# Patient Record
Sex: Male | Born: 1990 | Race: White | Hispanic: No | Marital: Single | Smoking: Current every day smoker
Health system: Southern US, Community
[De-identification: ages and names within clinical notes are randomized; demographics above are authoritative.]

## PROBLEM LIST (undated history)

## (undated) DIAGNOSIS — G459 Transient cerebral ischemic attack, unspecified: Secondary | ICD-10-CM

## (undated) DIAGNOSIS — I456 Pre-excitation syndrome: Secondary | ICD-10-CM

## (undated) HISTORY — PX: ELBOW FRACTURE SURGERY: SHX616

## (undated) HISTORY — PX: ABLATION: SHX5711

## (undated) HISTORY — PX: TONSILLECTOMY: SUR1361

## (undated) HISTORY — PX: CARDIAC CATHETERIZATION: SHX172

---
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
## 2008-05-26 ENCOUNTER — Emergency Department: Payer: Self-pay | Admitting: Emergency Medicine

## 2008-05-26 ENCOUNTER — Ambulatory Visit: Payer: Self-pay | Admitting: Internal Medicine

## 2008-05-28 ENCOUNTER — Inpatient Hospital Stay: Payer: Self-pay | Admitting: Internal Medicine

## 2008-06-13 ENCOUNTER — Emergency Department: Payer: Self-pay | Admitting: Emergency Medicine

## 2008-06-25 ENCOUNTER — Inpatient Hospital Stay: Payer: Self-pay | Admitting: Internal Medicine

## 2009-11-05 ENCOUNTER — Emergency Department: Payer: Self-pay | Admitting: Emergency Medicine

## 2010-01-18 ENCOUNTER — Emergency Department: Payer: Self-pay | Admitting: Emergency Medicine

## 2010-01-25 ENCOUNTER — Emergency Department: Payer: Self-pay | Admitting: Unknown Physician Specialty

## 2010-04-05 ENCOUNTER — Inpatient Hospital Stay: Payer: Self-pay | Admitting: Internal Medicine

## 2010-06-01 ENCOUNTER — Emergency Department: Payer: Self-pay | Admitting: Emergency Medicine

## 2010-08-03 ENCOUNTER — Ambulatory Visit: Payer: Self-pay | Admitting: Internal Medicine

## 2011-07-10 ENCOUNTER — Emergency Department: Payer: Self-pay | Admitting: Emergency Medicine

## 2012-02-16 ENCOUNTER — Emergency Department: Payer: Self-pay | Admitting: *Deleted

## 2012-02-17 LAB — URINALYSIS, COMPLETE
Blood: NEGATIVE
Glucose,UR: NEGATIVE mg/dL (ref 0–75)
Ketone: NEGATIVE
Ph: 8 (ref 4.5–8.0)
Protein: NEGATIVE
Specific Gravity: 1.004 (ref 1.003–1.030)
WBC UR: <1 /HPF (ref 0–5)

## 2012-04-07 ENCOUNTER — Emergency Department: Payer: Self-pay | Admitting: Emergency Medicine

## 2012-04-09 ENCOUNTER — Emergency Department: Payer: Self-pay

## 2012-04-11 LAB — CBC
HCT: 44.7 % (ref 40.0–52.0)
HGB: 15 g/dL (ref 13.0–18.0)
RDW: 13.6 % (ref 11.5–14.5)
WBC: 8.3 10*3/uL (ref 3.8–10.6)

## 2012-04-11 LAB — COMPREHENSIVE METABOLIC PANEL
Anion Gap: 6 — ABNORMAL LOW (ref 7–16)
BUN: 8 mg/dL (ref 7–18)
Bilirubin,Total: 0.7 mg/dL (ref 0.2–1.0)
Calcium, Total: 9.1 mg/dL (ref 8.5–10.1)
Chloride: 106 mmol/L (ref 98–107)
Co2: 30 mmol/L (ref 21–32)
Creatinine: 0.97 mg/dL (ref 0.60–1.30)
Osmolality: 279 (ref 275–301)
Potassium: 3.3 mmol/L — ABNORMAL LOW (ref 3.5–5.1)
SGPT (ALT): 26 U/L (ref 12–78)
Total Protein: 7.7 g/dL (ref 6.4–8.2)

## 2012-04-11 LAB — ETHANOL: Ethanol %: <0.003 % (ref 0.000–0.080)

## 2012-04-12 ENCOUNTER — Inpatient Hospital Stay: Payer: Self-pay | Admitting: Psychiatry

## 2012-04-12 LAB — URINALYSIS, COMPLETE
Bilirubin,UR: NEGATIVE
Blood: NEGATIVE
Glucose,UR: NEGATIVE mg/dL (ref 0–75)
Ketone: NEGATIVE
Leukocyte Esterase: NEGATIVE
Nitrite: NEGATIVE
Ph: 6 (ref 4.5–8.0)
Protein: NEGATIVE
RBC,UR: <1 /HPF (ref 0–5)
Specific Gravity: 1.003 (ref 1.003–1.030)
Squamous Epithelial: <1

## 2012-04-12 LAB — DRUG SCREEN, URINE
Barbiturates, Ur Screen: NEGATIVE (ref ?–200)
Methadone, Ur Screen: NEGATIVE (ref ?–300)
Opiate, Ur Screen: NEGATIVE (ref ?–300)
Tricyclic, Ur Screen: NEGATIVE (ref ?–1000)

## 2012-04-15 LAB — LIPID PANEL
Cholesterol: 103 mg/dL (ref 0–200)
Ldl Cholesterol, Calc: 33 mg/dL (ref 0–100)
Triglycerides: 230 mg/dL — ABNORMAL HIGH (ref 0–200)
VLDL Cholesterol, Calc: 46 mg/dL — ABNORMAL HIGH (ref 5–40)

## 2012-06-06 ENCOUNTER — Emergency Department: Payer: Self-pay | Admitting: Emergency Medicine

## 2012-08-22 DIAGNOSIS — F1110 Opioid abuse, uncomplicated: Secondary | ICD-10-CM | POA: Insufficient documentation

## 2012-08-22 DIAGNOSIS — F99 Mental disorder, not otherwise specified: Secondary | ICD-10-CM | POA: Insufficient documentation

## 2012-08-22 DIAGNOSIS — M25529 Pain in unspecified elbow: Secondary | ICD-10-CM | POA: Insufficient documentation

## 2012-09-08 DIAGNOSIS — I456 Pre-excitation syndrome: Secondary | ICD-10-CM | POA: Insufficient documentation

## 2012-09-08 DIAGNOSIS — F32A Depression, unspecified: Secondary | ICD-10-CM | POA: Insufficient documentation

## 2012-09-08 DIAGNOSIS — F319 Bipolar disorder, unspecified: Secondary | ICD-10-CM | POA: Insufficient documentation

## 2012-12-19 ENCOUNTER — Emergency Department: Payer: Self-pay | Admitting: Emergency Medicine

## 2012-12-19 LAB — CBC
HGB: 15.1 g/dL (ref 13.0–18.0)
MCHC: 34.3 g/dL (ref 32.0–36.0)
Platelet: 339 10*3/uL (ref 150–440)
RBC: 4.88 10*6/uL (ref 4.40–5.90)
RDW: 13.4 % (ref 11.5–14.5)

## 2012-12-19 LAB — COMPREHENSIVE METABOLIC PANEL
Alkaline Phosphatase: 92 U/L (ref 50–136)
Anion Gap: 6 — ABNORMAL LOW (ref 7–16)
BUN: 14 mg/dL (ref 7–18)
Co2: 27 mmol/L (ref 21–32)
Creatinine: 1.18 mg/dL (ref 0.60–1.30)
Glucose: 92 mg/dL (ref 65–99)
Potassium: 3.3 mmol/L — ABNORMAL LOW (ref 3.5–5.1)
SGOT(AST): 27 U/L (ref 15–37)
SGPT (ALT): 31 U/L (ref 12–78)
Sodium: 136 mmol/L (ref 136–145)
Total Protein: 7.7 g/dL (ref 6.4–8.2)

## 2012-12-21 ENCOUNTER — Ambulatory Visit: Payer: Self-pay | Admitting: Family Medicine

## 2013-02-04 ENCOUNTER — Emergency Department: Payer: Self-pay | Admitting: Emergency Medicine

## 2013-02-10 ENCOUNTER — Emergency Department: Payer: Self-pay | Admitting: Emergency Medicine

## 2013-02-11 ENCOUNTER — Ambulatory Visit: Payer: Self-pay | Admitting: Physician Assistant

## 2013-03-08 ENCOUNTER — Emergency Department: Payer: Self-pay | Admitting: Emergency Medicine

## 2013-03-08 LAB — URINALYSIS, COMPLETE
Bilirubin,UR: NEGATIVE
Blood: NEGATIVE
Glucose,UR: NEGATIVE mg/dL (ref 0–75)
Ketone: NEGATIVE
Nitrite: NEGATIVE
Ph: 6 (ref 4.5–8.0)

## 2013-03-08 LAB — COMPREHENSIVE METABOLIC PANEL
Alkaline Phosphatase: 92 U/L (ref 50–136)
Anion Gap: 5 — ABNORMAL LOW (ref 7–16)
Bilirubin,Total: 0.3 mg/dL (ref 0.2–1.0)
Creatinine: 1.05 mg/dL (ref 0.60–1.30)
EGFR (African American): >60
EGFR (Non-African Amer.): >60
Osmolality: 278 (ref 275–301)
SGPT (ALT): 30 U/L (ref 12–78)

## 2013-03-08 LAB — CBC
HCT: 42.7 % (ref 40.0–52.0)
HGB: 14.7 g/dL (ref 13.0–18.0)
MCH: 30.7 pg (ref 26.0–34.0)
Platelet: 325 10*3/uL (ref 150–440)
RBC: 4.8 10*6/uL (ref 4.40–5.90)
RDW: 13.6 % (ref 11.5–14.5)

## 2013-03-08 LAB — DRUG SCREEN, URINE
Benzodiazepine, Ur Scrn: NEGATIVE (ref ?–200)
Cocaine Metabolite,Ur ~~LOC~~: POSITIVE (ref ?–300)
Methadone, Ur Screen: NEGATIVE (ref ?–300)
Opiate, Ur Screen: NEGATIVE (ref ?–300)
Phencyclidine (PCP) Ur S: NEGATIVE (ref ?–25)
Tricyclic, Ur Screen: NEGATIVE (ref ?–1000)

## 2013-03-08 LAB — ACETAMINOPHEN LEVEL: Acetaminophen: <2 ug/mL

## 2013-03-08 LAB — ETHANOL
Ethanol %: 0.135 % — ABNORMAL HIGH (ref 0.000–0.080)
Ethanol: 135 mg/dL

## 2013-03-08 LAB — SALICYLATE LEVEL: Salicylates, Serum: 4.3 mg/dL — ABNORMAL HIGH

## 2013-03-21 ENCOUNTER — Observation Stay: Payer: Self-pay | Admitting: Internal Medicine

## 2013-03-21 DIAGNOSIS — F17200 Nicotine dependence, unspecified, uncomplicated: Secondary | ICD-10-CM

## 2013-03-21 DIAGNOSIS — R079 Chest pain, unspecified: Secondary | ICD-10-CM

## 2013-03-21 DIAGNOSIS — F1910 Other psychoactive substance abuse, uncomplicated: Secondary | ICD-10-CM

## 2013-03-21 LAB — CBC
HGB: 16.1 g/dL (ref 13.0–18.0)
MCH: 30.8 pg (ref 26.0–34.0)
Platelet: 334 10*3/uL (ref 150–440)
RBC: 5.21 10*6/uL (ref 4.40–5.90)
WBC: 12.9 10*3/uL — ABNORMAL HIGH (ref 3.8–10.6)

## 2013-03-21 LAB — DRUG SCREEN, URINE
Amphetamines, Ur Screen: NEGATIVE (ref ?–1000)
Benzodiazepine, Ur Scrn: NEGATIVE (ref ?–200)
Cannabinoid 50 Ng, Ur ~~LOC~~: POSITIVE (ref ?–50)
Cocaine Metabolite,Ur ~~LOC~~: POSITIVE (ref ?–300)
MDMA (Ecstasy)Ur Screen: NEGATIVE (ref ?–500)
Phencyclidine (PCP) Ur S: NEGATIVE (ref ?–25)
Tricyclic, Ur Screen: NEGATIVE (ref ?–1000)

## 2013-03-21 LAB — CK TOTAL AND CKMB (NOT AT ARMC): CK, Total: 110 U/L (ref 35–232)

## 2013-03-21 LAB — ETHANOL
Ethanol %: 0.072 % (ref 0.000–0.080)
Ethanol: 72 mg/dL

## 2013-03-21 LAB — BASIC METABOLIC PANEL
Anion Gap: 9 (ref 7–16)
BUN: 16 mg/dL (ref 7–18)
Calcium, Total: 9.6 mg/dL (ref 8.5–10.1)
Chloride: 108 mmol/L — ABNORMAL HIGH (ref 98–107)
Co2: 22 mmol/L (ref 21–32)
EGFR (African American): >60
EGFR (Non-African Amer.): >60
Glucose: 86 mg/dL (ref 65–99)

## 2013-03-21 LAB — TROPONIN I
Troponin-I: <0.02 ng/mL
Troponin-I: <0.02 ng/mL

## 2013-03-21 LAB — ACETAMINOPHEN LEVEL: Acetaminophen: <2 ug/mL

## 2013-03-21 LAB — SALICYLATE LEVEL: Salicylates, Serum: 4.2 mg/dL — ABNORMAL HIGH

## 2013-03-28 LAB — CBC
HCT: 44.6 % (ref 40.0–52.0)
HGB: 15.2 g/dL (ref 13.0–18.0)
MCHC: 34 g/dL (ref 32.0–36.0)
Platelet: 303 10*3/uL (ref 150–440)
RBC: 4.92 10*6/uL (ref 4.40–5.90)

## 2013-03-28 LAB — COMPREHENSIVE METABOLIC PANEL
Alkaline Phosphatase: 72 U/L
Anion Gap: 7 (ref 7–16)
BUN: 14 mg/dL (ref 7–18)
Bilirubin,Total: 0.5 mg/dL (ref 0.2–1.0)
Calcium, Total: 9.3 mg/dL (ref 8.5–10.1)
Chloride: 103 mmol/L (ref 98–107)
Co2: 27 mmol/L (ref 21–32)
Creatinine: 1.11 mg/dL (ref 0.60–1.30)
EGFR (African American): >60
EGFR (Non-African Amer.): >60
Glucose: 91 mg/dL (ref 65–99)
Osmolality: 274 (ref 275–301)
Potassium: 3.4 mmol/L — ABNORMAL LOW (ref 3.5–5.1)
Sodium: 137 mmol/L (ref 136–145)

## 2013-03-28 LAB — URINALYSIS, COMPLETE
Bacteria: NONE SEEN
Ketone: NEGATIVE
Nitrite: NEGATIVE
Ph: 6 (ref 4.5–8.0)
RBC,UR: NONE SEEN /HPF (ref 0–5)
Squamous Epithelial: <1

## 2013-03-28 LAB — DRUG SCREEN, URINE
Barbiturates, Ur Screen: NEGATIVE (ref ?–200)
Benzodiazepine, Ur Scrn: NEGATIVE (ref ?–200)
Cannabinoid 50 Ng, Ur ~~LOC~~: POSITIVE (ref ?–50)
MDMA (Ecstasy)Ur Screen: NEGATIVE (ref ?–500)
Methadone, Ur Screen: NEGATIVE (ref ?–300)
Opiate, Ur Screen: NEGATIVE (ref ?–300)
Phencyclidine (PCP) Ur S: NEGATIVE (ref ?–25)
Tricyclic, Ur Screen: NEGATIVE (ref ?–1000)

## 2013-03-28 LAB — ETHANOL
Ethanol %: 0.167 % — ABNORMAL HIGH (ref 0.000–0.080)
Ethanol: 167 mg/dL

## 2013-03-28 LAB — ACETAMINOPHEN LEVEL: Acetaminophen: <2 ug/mL

## 2013-03-28 LAB — SALICYLATE LEVEL: Salicylates, Serum: 2.7 mg/dL

## 2013-03-29 ENCOUNTER — Inpatient Hospital Stay: Payer: Self-pay | Admitting: Psychiatry

## 2013-05-16 ENCOUNTER — Emergency Department: Payer: Self-pay | Admitting: Emergency Medicine

## 2013-05-16 LAB — CBC
HCT: 44.7 % (ref 40.0–52.0)
HGB: 14.6 g/dL (ref 13.0–18.0)
MCH: 29.6 pg (ref 26.0–34.0)
MCHC: 32.6 g/dL (ref 32.0–36.0)
MCV: 91 fL (ref 80–100)
PLATELETS: 421 10*3/uL (ref 150–440)
RBC: 4.94 10*6/uL (ref 4.40–5.90)
RDW: 13.5 % (ref 11.5–14.5)
WBC: 16.2 10*3/uL — ABNORMAL HIGH (ref 3.8–10.6)

## 2013-05-16 LAB — URINALYSIS, COMPLETE
BLOOD: NEGATIVE
Bacteria: NONE SEEN
Bilirubin,UR: NEGATIVE
GLUCOSE, UR: NEGATIVE mg/dL (ref 0–75)
Ketone: NEGATIVE
Nitrite: NEGATIVE
PROTEIN: NEGATIVE
Ph: 6 (ref 4.5–8.0)
RBC,UR: 1 /HPF (ref 0–5)
Specific Gravity: 1.001 (ref 1.003–1.030)
Squamous Epithelial: NONE SEEN
WBC UR: 6 /HPF (ref 0–5)

## 2013-05-16 LAB — COMPREHENSIVE METABOLIC PANEL
AST: 33 U/L (ref 15–37)
Albumin: 3.8 g/dL (ref 3.4–5.0)
Alkaline Phosphatase: 94 U/L
Anion Gap: 5 — ABNORMAL LOW (ref 7–16)
BILIRUBIN TOTAL: 0.3 mg/dL (ref 0.2–1.0)
BUN: 12 mg/dL (ref 7–18)
CHLORIDE: 103 mmol/L (ref 98–107)
CO2: 29 mmol/L (ref 21–32)
CREATININE: 1.03 mg/dL (ref 0.60–1.30)
Calcium, Total: 9.3 mg/dL (ref 8.5–10.1)
EGFR (African American): >60
Glucose: 105 mg/dL — ABNORMAL HIGH (ref 65–99)
Osmolality: 274 (ref 275–301)
POTASSIUM: 3.6 mmol/L (ref 3.5–5.1)
SGPT (ALT): 59 U/L (ref 12–78)
Sodium: 137 mmol/L (ref 136–145)
TOTAL PROTEIN: 8.1 g/dL (ref 6.4–8.2)

## 2013-05-16 LAB — ETHANOL
ETHANOL %: 0.059 % (ref 0.000–0.080)
ETHANOL LVL: 59 mg/dL

## 2013-05-16 LAB — ACETAMINOPHEN LEVEL: Acetaminophen: <2 ug/mL

## 2013-05-16 LAB — DRUG SCREEN, URINE
Amphetamines, Ur Screen: NEGATIVE (ref ?–1000)
BARBITURATES, UR SCREEN: NEGATIVE (ref ?–200)
Benzodiazepine, Ur Scrn: NEGATIVE (ref ?–200)
COCAINE METABOLITE, UR ~~LOC~~: POSITIVE (ref ?–300)
Cannabinoid 50 Ng, Ur ~~LOC~~: POSITIVE (ref ?–50)
MDMA (ECSTASY) UR SCREEN: NEGATIVE (ref ?–500)
METHADONE, UR SCREEN: NEGATIVE (ref ?–300)
OPIATE, UR SCREEN: NEGATIVE (ref ?–300)
Phencyclidine (PCP) Ur S: NEGATIVE (ref ?–25)
Tricyclic, Ur Screen: NEGATIVE (ref ?–1000)

## 2013-05-16 LAB — SALICYLATE LEVEL: SALICYLATES, SERUM: 2 mg/dL

## 2013-06-02 LAB — COMPREHENSIVE METABOLIC PANEL
ALK PHOS: 81 U/L
Albumin: 3.6 g/dL (ref 3.4–5.0)
Anion Gap: 9 (ref 7–16)
BILIRUBIN TOTAL: 0.6 mg/dL (ref 0.2–1.0)
BUN: 8 mg/dL (ref 7–18)
CALCIUM: 9 mg/dL (ref 8.5–10.1)
CHLORIDE: 103 mmol/L (ref 98–107)
Co2: 25 mmol/L (ref 21–32)
Creatinine: 1.01 mg/dL (ref 0.60–1.30)
EGFR (African American): >60
EGFR (Non-African Amer.): >60
GLUCOSE: 97 mg/dL (ref 65–99)
Osmolality: 272 (ref 275–301)
Potassium: 3.1 mmol/L — ABNORMAL LOW (ref 3.5–5.1)
SGOT(AST): 18 U/L (ref 15–37)
SGPT (ALT): 23 U/L (ref 12–78)
Sodium: 137 mmol/L (ref 136–145)
TOTAL PROTEIN: 7.3 g/dL (ref 6.4–8.2)

## 2013-06-02 LAB — URINALYSIS, COMPLETE
BACTERIA: NONE SEEN
Bilirubin,UR: NEGATIVE
Blood: NEGATIVE
Glucose,UR: NEGATIVE mg/dL (ref 0–75)
Ketone: NEGATIVE
Leukocyte Esterase: NEGATIVE
NITRITE: NEGATIVE
Ph: 7 (ref 4.5–8.0)
Protein: NEGATIVE
RBC,UR: NONE SEEN /HPF (ref 0–5)
Specific Gravity: 1.009 (ref 1.003–1.030)
Squamous Epithelial: <1

## 2013-06-02 LAB — CBC
HCT: 41.3 % (ref 40.0–52.0)
HGB: 14 g/dL (ref 13.0–18.0)
MCH: 30.1 pg (ref 26.0–34.0)
MCHC: 33.8 g/dL (ref 32.0–36.0)
MCV: 89 fL (ref 80–100)
Platelet: 362 10*3/uL (ref 150–440)
RBC: 4.64 10*6/uL (ref 4.40–5.90)
RDW: 13.4 % (ref 11.5–14.5)
WBC: 7.6 10*3/uL (ref 3.8–10.6)

## 2013-06-02 LAB — DRUG SCREEN, URINE
Amphetamines, Ur Screen: POSITIVE (ref ?–1000)
BARBITURATES, UR SCREEN: NEGATIVE (ref ?–200)
BENZODIAZEPINE, UR SCRN: NEGATIVE (ref ?–200)
CANNABINOID 50 NG, UR ~~LOC~~: POSITIVE (ref ?–50)
Cocaine Metabolite,Ur ~~LOC~~: POSITIVE (ref ?–300)
MDMA (ECSTASY) UR SCREEN: NEGATIVE (ref ?–500)
METHADONE, UR SCREEN: NEGATIVE (ref ?–300)
Opiate, Ur Screen: NEGATIVE (ref ?–300)
Phencyclidine (PCP) Ur S: NEGATIVE (ref ?–25)
TRICYCLIC, UR SCREEN: NEGATIVE (ref ?–1000)

## 2013-06-02 LAB — ACETAMINOPHEN LEVEL: Acetaminophen: <2 ug/mL

## 2013-06-02 LAB — SALICYLATE LEVEL: Salicylates, Serum: 2.8 mg/dL

## 2013-06-02 LAB — TSH: THYROID STIMULATING HORM: 0.385 u[IU]/mL — AB

## 2013-06-02 LAB — ETHANOL
Ethanol %: 0.061 % (ref 0.000–0.080)
Ethanol: 61 mg/dL

## 2013-06-03 ENCOUNTER — Inpatient Hospital Stay: Payer: Self-pay | Admitting: Psychiatry

## 2013-06-14 ENCOUNTER — Emergency Department: Payer: Self-pay | Admitting: Emergency Medicine

## 2013-06-14 LAB — COMPREHENSIVE METABOLIC PANEL
ALK PHOS: 89 U/L
ALT: 25 U/L (ref 12–78)
AST: 20 U/L (ref 15–37)
Albumin: 4.1 g/dL (ref 3.4–5.0)
Anion Gap: 5 — ABNORMAL LOW (ref 7–16)
BUN: 16 mg/dL (ref 7–18)
Bilirubin,Total: 0.7 mg/dL (ref 0.2–1.0)
CO2: 31 mmol/L (ref 21–32)
Calcium, Total: 9.4 mg/dL (ref 8.5–10.1)
Chloride: 103 mmol/L (ref 98–107)
Creatinine: 1.36 mg/dL — ABNORMAL HIGH (ref 0.60–1.30)
EGFR (African American): >60
GLUCOSE: 108 mg/dL — AB (ref 65–99)
OSMOLALITY: 279 (ref 275–301)
POTASSIUM: 3.5 mmol/L (ref 3.5–5.1)
SODIUM: 139 mmol/L (ref 136–145)
Total Protein: 7.9 g/dL (ref 6.4–8.2)

## 2013-06-14 LAB — DRUG SCREEN, URINE
Amphetamines, Ur Screen: NEGATIVE (ref ?–1000)
Barbiturates, Ur Screen: NEGATIVE (ref ?–200)
Benzodiazepine, Ur Scrn: NEGATIVE (ref ?–200)
CANNABINOID 50 NG, UR ~~LOC~~: POSITIVE (ref ?–50)
COCAINE METABOLITE, UR ~~LOC~~: POSITIVE (ref ?–300)
MDMA (Ecstasy)Ur Screen: NEGATIVE (ref ?–500)
Methadone, Ur Screen: NEGATIVE (ref ?–300)
Opiate, Ur Screen: NEGATIVE (ref ?–300)
PHENCYCLIDINE (PCP) UR S: NEGATIVE (ref ?–25)
Tricyclic, Ur Screen: POSITIVE (ref ?–1000)

## 2013-06-14 LAB — URINALYSIS, COMPLETE
BLOOD: NEGATIVE
Bacteria: NONE SEEN
Bilirubin,UR: NEGATIVE
GLUCOSE, UR: NEGATIVE mg/dL (ref 0–75)
KETONE: NEGATIVE
Leukocyte Esterase: NEGATIVE
Nitrite: NEGATIVE
Ph: 5 (ref 4.5–8.0)
Protein: NEGATIVE
RBC,UR: 2 /HPF (ref 0–5)
Specific Gravity: 1.025 (ref 1.003–1.030)
WBC UR: 7 /HPF (ref 0–5)

## 2013-06-14 LAB — CBC
HCT: 45 % (ref 40.0–52.0)
HGB: 15 g/dL (ref 13.0–18.0)
MCH: 29.5 pg (ref 26.0–34.0)
MCHC: 33.3 g/dL (ref 32.0–36.0)
MCV: 89 fL (ref 80–100)
Platelet: 317 10*3/uL (ref 150–440)
RBC: 5.08 10*6/uL (ref 4.40–5.90)
RDW: 13.9 % (ref 11.5–14.5)
WBC: 8.1 10*3/uL (ref 3.8–10.6)

## 2013-06-14 LAB — ETHANOL: Ethanol %: <0.003 % (ref 0.000–0.080)

## 2013-06-14 LAB — SALICYLATE LEVEL: Salicylates, Serum: 2 mg/dL

## 2013-06-14 LAB — ACETAMINOPHEN LEVEL: Acetaminophen: <2 ug/mL

## 2013-11-21 ENCOUNTER — Emergency Department: Payer: Self-pay | Admitting: Emergency Medicine

## 2014-03-12 ENCOUNTER — Emergency Department: Payer: Self-pay | Admitting: Emergency Medicine

## 2014-04-26 ENCOUNTER — Emergency Department: Payer: Self-pay | Admitting: Emergency Medicine

## 2014-05-21 ENCOUNTER — Emergency Department: Payer: Self-pay | Admitting: Emergency Medicine

## 2014-05-22 LAB — COMPREHENSIVE METABOLIC PANEL
ALK PHOS: 76 U/L (ref 46–116)
ANION GAP: 6 — AB (ref 7–16)
AST: 28 U/L (ref 15–37)
Albumin: 3.9 g/dL (ref 3.4–5.0)
BUN: 12 mg/dL (ref 7–18)
Bilirubin,Total: 0.3 mg/dL (ref 0.2–1.0)
CALCIUM: 9 mg/dL (ref 8.5–10.1)
CHLORIDE: 108 mmol/L — AB (ref 98–107)
CREATININE: 0.94 mg/dL (ref 0.60–1.30)
Co2: 28 mmol/L (ref 21–32)
EGFR (African American): >60
GLUCOSE: 92 mg/dL (ref 65–99)
OSMOLALITY: 283 (ref 275–301)
POTASSIUM: 3.5 mmol/L (ref 3.5–5.1)
SGPT (ALT): 31 U/L (ref 14–63)
Sodium: 142 mmol/L (ref 136–145)
Total Protein: 6.9 g/dL (ref 6.4–8.2)

## 2014-05-22 LAB — CBC WITH DIFFERENTIAL/PLATELET
Basophil #: 0.1 10*3/uL (ref 0.0–0.1)
Basophil %: 0.7 %
EOS PCT: 2.7 %
Eosinophil #: 0.2 10*3/uL (ref 0.0–0.7)
HCT: 43.3 % (ref 40.0–52.0)
HGB: 14.8 g/dL (ref 13.0–18.0)
Lymphocyte #: 2.8 10*3/uL (ref 1.0–3.6)
Lymphocyte %: 36.2 %
MCH: 30.4 pg (ref 26.0–34.0)
MCHC: 34.1 g/dL (ref 32.0–36.0)
MCV: 89 fL (ref 80–100)
MONOS PCT: 8.2 %
Monocyte #: 0.6 x10 3/mm (ref 0.2–1.0)
NEUTROS PCT: 52.2 %
Neutrophil #: 4.1 10*3/uL (ref 1.4–6.5)
Platelet: 285 10*3/uL (ref 150–440)
RBC: 4.86 10*6/uL (ref 4.40–5.90)
RDW: 13.2 % (ref 11.5–14.5)
WBC: 7.8 10*3/uL (ref 3.8–10.6)

## 2014-07-09 ENCOUNTER — Emergency Department: Payer: Self-pay | Admitting: Emergency Medicine

## 2014-08-10 NOTE — Discharge Summary (Signed)
PATIENT NAME:  Michael LevanSMITH, Jamine MR#:  [id number] DATE OF BIRTH:  1991/03/10  DATE OF ADMISSION:  04/12/2012 DATE OF DISCHARGE:    HOSPITAL COURSE: The patient was admitted to the hospital after presenting to the Emergency Room stating that he was feeling out of control and insinuating suicidal ideation and feeling like his medications were not right. In the hospital, he has denied any suicidal ideation and has not behaved in a dangerous or aggressive way. He has been compliant with medication. The patient has stated his belief that taking Klonopin and Ativan were highly effective for him in the past. Review of past chart suggests a long history of substance dependence with antipsychotics probably being of  the most benefit in helping with his acute symptoms.  Diagnosis has been changed several times over the years, but the symptom pattern I see looks to be most consistent with schizoaffective disorder, substance abuse and probably antisocial personality disorder. The patient was advised that he would not be put on standing doses of benzodiazepines as there was no real clinical indication for them, and he had a history of substance abuse. I did agree to put him on both Seroquel and Geodon, given that he has history, by his report, of having failed treatment with several individual antipsychotics and also of having severe EPS versus NMS with Haldol in the past, not wanting to be back on clozapine, not having been really compliant with clozapine. The patient was requesting to be on the combination of Seroquel and Geodon and has tolerated it well. At this point he is denying suicidal ideation and denying any hallucinations or other acute psychotic symptoms. He is agreeable to continuing with follow-up at [facility] in the community and has a safe place to live. He has been educated about the importance of staying sober and off of all drugs of abuse and continuing to be compliant with treatment to avoid worsening symptoms or  suicidal ideation.   MEDICATIONS AT DISCHARGE:  Ziprasidone 80 mg twice a day with food, trazodone 150 mg at night, Seroquel 400 mg twice a day, gabapentin 300 mg at bedtime, Celexa 20 mg per day.   LABORATORY RESULTS: Admission labs showed a drug screen that was negative. TSH normal at 1.15. Alcohol undetectable.  Glucose low at 62, potassium slightly low at 3.3. CBC was normal. Urinalysis unremarkable. Follow-up with the lipids showed an elevated triglycerides at 230, HDL low at 24, VLDL high at 46.   MENTAL STATUS EXAM AT DISCHARGE:  Casually dressed, neatly groomed man who looks his stated age. Cooperative with the interview. Good eye contact. Normal psychomotor activity. Speech normal in rate, tone, and volume. Affect euthymic, reactive. Mood stated as fine. Thoughts are lucid without loosening of associations. Denies auditory or visual hallucinations. Denies suicidal or homicidal ideation. Shows improved judgment and insight, of normal intelligence. Alert and oriented x 4.   DISPOSITION: Follow-up with [facility], continue to stay at home with his family.   DIAGNOSES PRINCIPAL AND PRIMARY: AXIS I: Schizoaffective disorder, bipolar type, depressed.   SECONDARY DIAGNOSES: AXIS I:  Polysubstance dependence.  AXIS II:  Antisocial personality traits, at least.  AXIS III:  None.  AXIS IV: Moderate to severe from primary support, lack of cooperation often with outpatient treatment.  AXIS V:  Functioning at time of discharge 60.     ____________________________ Audery AmelJohn T. Berneta Sconyers, MD jtc:ct D: 04/15/2012 10:53:28 ET T: 04/15/2012 11:37:15 ET JOB#: [id number]  cc: Audery AmelJohn T. Sharilyn Geisinger, MD, <Dictator> Audery AmelJOHN T Perez Dirico MD ELECTRONICALLY  SIGNED 04/17/2012 16:28

## 2014-08-10 NOTE — H&P (Signed)
PATIENT NAME:  Michael Michael [name], Michael [name] MR#:  [id number] DATE OF BIRTH:  07-25-1990  DATE OF ADMISSION:  04/12/2012  INITIAL ASSESSMENT AND PSYCHIATRIC EVALUATION   IDENTIFYING INFORMATION: The patient is a 24 year old white male, not employed, never married and lives with his adopted mother who is 49 years old.  CHIEF COMPLAINT: The patient comes in to psychiatry at [facility] with a chief complaint "I just want to be on the right medication.  My doctor at [facility] is not giving me the right medication at this time".    HISTORY OF PRESENT ILLNESS:  According to information obtained from the chart, the patient has not been taking his medications for 8 months.  He reports that he was on Ativan 2 mg 4 times a day and also Klonopin 1 mg 4 times a day as needed and Geodon.  His currently doctor refused to give that medication and put him on Seroquel 400 mg at bedtime which has not been helping.  He wanted to get admitted to the hospital because he is getting increasingly depressed and not feeling well and wants to be back on the medications that he had been getting.    PAST PSYCHIATRIC HISTORY:  This is the 5th or 6th  inpatient on psychiatry.  First inpatient for psychiatry was at a very young age.  The longest period in inpatient psychiatry was for 8 months at [facility] in [place] where he was an inpatient for 8 months because he tried to hurt himself and other people and was hearing voices and was paranoid.  He was last admitted to 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  The patient reports that he was being followed at [facility] and his last appointment was quite some time ago as he is not getting the medication of his  choice.  History of suicide attempt twice.  He tried to overdose on pills on 1 occasion and tried to eat a bar of soap on 1 occasion.  FAMILY HISTORY OF MENTAL ILLNESS:  The patient lives with adoptive mother.  Biological mother has mental illness.  Younger brother has mental illness.  No known history of suicides in the family.  FAMILY HISTORY:  Raised by adoptive mother.  Dropped out in 10th grade because he did not like school, no GED.    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  The longest job lasted for 4 years in [employer].  He last worked 4 years ago.    MARRIAGES:  Never married.  Dated a little bit.  No children.    ALCOHOL AND DRUGS:  The first drink of alcohol was at 10 years.  He was going through alcohol withdrawal.  Admits that currently he drinks alcohol on a social basis.  Has 1 DWI that was 2-1/2 years ago and lost his driver's license.  He got his driver's license back.  Denies smoking THC.  Denies using crack cocaine, but according to the information obtained he told the previous doctor that his drug of choice was crack cocaine.  He reports, "I don't do that stuff anymore."  He last used crack cocaine a long time ago.  Smokes cigarettes at a rate of a pack a half a day for  many years.    MEDICAL HISTORY:  No known history of high blood pressure.  No known history of diabetes mellitus.  Status post tonsillectomy and adenoidectomy many years ago.  Had a fracture of the left arm wrestling in the past.  Uses a brace on the left elbow for stability because recently he had a fall, on Monday, 04/07/2012.  He had head trauma with loss of consciousness 5 minutes but never had seizures.  History of attention deficit hyperactivity disorder, combined type.  He is being [place]ellen [place]Eason [place].  Last appointment was quite some time ago.  Next appointment is to be made.  LEGAL HISTORY:  The patient served 2 years 4 months in prison  and was released only 8 months for obtaining property on false presence.  He is on probation for 26 months more.  He admits that he has been clean from alcohol or drugs while he was in prison.  ALLERGIES:  NO KNOWN DRUG ALLERGIES.  PHYSICAL EXAMINATION:  VITAL SIGNS:  Temperature 97.2, pulse 66 per minute and regular, respirations 20 per minute and regular, blood pressure 116/70 mmHg. HEENT: Head is normocephalic, atraumatic.  Eyes: PERLA.  Fundi are bilaterally benign.  EOMs are intact.  Tympanic membranes are moist.  No exudates. NECK: Supple without any organomegaly, lymphadenopathy or thyromegaly.  CHEST: Normal expansion, normal breath sounds heard.  HEART:  Normal S1, S2 without any murmurs or gallops.  ABDOMEN:  Soft, no organomegaly.  Bowel sounds heard.   RECTAL:  Deferred. EXTREMITIES:  The patient has a brace on his left arm, wearing a splint because he had a fall on 04/07/2012. NEUROLOGIC: Gait is normal.  Romberg is normal.  Cranial nerves II through XII are intact. DTRs 2+ and intact.  Plantars are normal MENTAL STATUS EXAMINATION: The patient is dressed in street clothes.  Alert and oriented to place, but he said it was the 19th of December, 2013 which was wrong because today is the 21st.  Admit feeling depressed because he is not getting the medications of his choice.  Admits to feeling anxious because of not getting the medications of his choice.  He feels hopeless but does not feel helpless because he thinks he is going to be helped.  Denies feeling useless or worthless.  Denies any ideas or plans to hurt himself or others.  He stated, "I don't want to hurt myself".  Denies paranoid or suspicious ideas.  Denies hearing voices or seeing things.  Appears to be restless, fidgety and labile with his mood and affect.  Speech is coherent but rather pressured and rapid.  He is upset and angry about him not getting the medications that he wants.  He denies any auditory or visual  hallucinations.  Memory is intact and recall is good.  He could remember all the 3 items that I asked him after a few minutes.  Concentration is less and he is not able to focus.  He can spell the word "world" forward.  When he was asked to spell the word "world" backward, he said, "I just woke up, I said, I cannot focus", very impulsive with poor judgment.  Intelligence is average.  Insight and judgment guarded.  IMPRESSION: AXIS I:  Attention deficit hyperactivity disorder, combined type.  History of polysubstance dependence in remission - cocaine and Delta-9-tetrahydrocannabinol, bipolar disorder not otherwise specified. AXIS II:  Personality disorder with antisocial traits. AXIS III:  Status post tonsillectomy, status post adenoidectomy, status post fracture  of left elbow and surgery for the same.  He has a brace put in.   AXIS IV:  Severe - occupational and financial, history of polysubstance dependence, developmental stress.  Currently he has problems with the legal system and is on probation. AXIS  V:  Global Assessment of Functioning is 25.  PLAN:  The patient was admitted to [facility] for close observation and management.  He will be started back on the medications that he has been taking and this will be adjusted until he is stabilized.  During his stay in the hospital he will begin milieu therapy and supportive counseling.  He will take part in group therapy where compliance issues will be addressed along with legal issues.  At the time of discharge, the patient's medications will be stabilized  and appropriate followup appointment will be made in the community.    ____________________________ Jannet Mantis. Guss Bunde, MD skc:ct D: 04/12/2012 21:02:00 ET T: 04/13/2012 09:53:12 ET JOB#: [id number]  cc: Monika Salk K. Guss Bunde, MD, <Dictator> Beau Fanny MD ELECTRONICALLY SIGNED 04/13/2012 21:21

## 2014-08-13 NOTE — H&P (Signed)
PATIENT NAME:  Michael [name], Michael [name] MR#:  [id number] DATE OF BIRTH:  12/07/1990  DATE OF ADMISSION:  03/21/2013.  PRIMARY CARE PHYSICIAN:  None.  HISTORY OF PRESENT ILLNESS:  The patient is a 24 year old Caucasian male with past medical history significant for a history of psychiatric disease, ADHD, bipolar disorder and polysubstance abuse, who presents to the hospital with complaints of chest pains. The patient was doing well up until morning, around 3 a.m., when he started having left-sided chest pain. The pain was described as sharp intermittent pain, came on slowly and progressively worsening, and then became constant, radiating to left side of the body with some left-sided tingling, numbness, which lasted approximately one hour. He was also experiencing shortness of breath as well as palpitations in his chest, felt lightheaded as well as dizzy and diaphoretic and felt presyncopal. He was also having some headaches on the right side of his head towards the back. Because of this discomfort, he decided to come to Emergency Room for further evaluation. In the Emergency Room, he was noted to have urine drug screen which was positive for cocaine, and hospitalist services were contacted for admission. The patient admitted to snorting cocaine at around 7:00 p.m. last night. He admits of having had chest pains in the past which he had with palpitations with WPW syndrome. He had operation for WPW two years ago at [facility].  PAST MEDICAL HISTORY:  Significant for history of self-inflicted cuts in November 2014, history of multiple psychiatric admissions, bipolar disorder, ADHD, substance abuse, suicide with overdose, polysubstance dependence.   MEDICATIONS:  According to the patient, the patient was supposed to be on paroxetine, lithium, trazodone, Geodon, Neurontin as well as Zyprexa; however, he is not taking any medications. In the past, he was also on Suboxone because of opiate dependence as well as Xanax. Not taking this  as well.   PAST SURGICAL HISTORY:  Tonsillectomy and adenoidectomy, also left elbow operation, which left him with limited range of motion.   ALLERGIES:  HALDOL, VICODIN AS WELL AS RISPERDAL.   FAMILY HISTORY:  Hypertension as well as coronary artery disease. No stroke, diabetes or cancers. He is adopted.   SOCIAL HISTORY:  The patient is single, has no children. Smokes approximately 2 packs a day since the age of 24. Also does cocaine intermittently and marijuana. In the past, he was addicted to opiates, denies any opiate use recently. Drinks alcohol occasionally socially; yesterday, however, he drank at least 6 or 7 shots of vodka.   REVIEW OF SYSTEMS:  CONSTITUTIONAL:  Positive for feeling chills earlier today as well as fatigue and weakness, pains in the chest, glasses for nearsightedness, sinus congestion, some cough and greenish-brownish phlegm, intermittently, also intermittent wheezing, significant extreme shortness of breath as well as arrhythmias earlier today with chest pains, feeling presyncopal on top of having palpitations as well as nauseated. Denies any fevers, weight loss or gain, denies blurry vision, double vision, glaucoma or cataracts.  EARS, NOSE, THROAT:  Denies any tinnitus, allergies, epistaxis, sinus tenderness, dentures, difficulty swallowing. RESPIRATORY:  Denies any wheezes, asthma, COPD.  CARDIOVASCULAR:  Denies any orthopnea, edema, arrhythmias, palpitations or syncope except as mentioned above.  GASTROINTESTINAL:  Denies any nausea, vomiting, diarrhea, abdominal pain, constipation, or change in bowel habits.  GENITOURINARY:  Denies dysuria, hematuria, frequency, incontinence. ENDOCRINE:  Denies polyuria, nocturia or thyroid problems, heat or cold intolerance or thirst.   SKIN:  Denies any acne, rashes or change in moles.  MUSCULOSKELETAL:  Denies arthritis, cramps, joint  swelling or gout. NEUROLOGICAL:  No numbness, epilepsy, or tremor.  PSYCHIATRIC:  Denies  anxiety, insomnia, depression.   PHYSICAL EXAMINATION:  VITAL SIGNS:  On arrival to the hospital, temperature was not measured, pulse was 80s to 90s, respiration rate was 18, blood pressure 121/69, saturation was 97% on room air.  GENERAL:  This is a well-developed, well-nourished, thin Caucasian male in no significant distress, somewhat anxious, lying on the stretcher.  HEENT:   Pupils are equal, reactive to light. Extraocular muscles intact. No conjunctivitis. Has normal hearing. No pharyngeal erythema. Mucosa is moist.  NECK:   No masses. Supple, nontender. Thyroid is not enlarged. No adenopathy. No JVD or carotid bruits bilaterally. Full range of motion.  LUNGS:   Clear to auscultation in all fields. Somewhat diminished breath sounds, but otherwise no rales, rhonchi or wheezing. Unlabored respirations.  CHEST:  Clear to auscultation, no dullness to percussion or overt respiratory distress. CARDIOVASCULAR:  S1 and S2 appreciated, rhythm is regular. PMI not lateralized. Chest is somewhat tender to palpation on the left side. Pedal pulses 1+.  EXTREMITIES:  No lower extremity edema, clubbing or cyanosis was noted.  ABDOMEN:  Soft, nontender. Bowel sounds are present. No hepatosplenomegaly or masses were noted.  RECTAL:  Deferred.  MUSCLE STRENGTH:  Able to move all extremities. No cyanosis, degenerative joint disease or kyphosis. Gait is not tested.  SKIN:  Revealed multiple tattoos. No other rash, lesions, erythema, nodularity. Skin was warm and dry to palpation. LYMPH:  No adenopathy in the cervical region. NEUROLOGIC:  Cranial nerves grossly intact. Sensory is intact. No dysarthria or aphasia. The patient is alert and oriented to time, person and place, cooperative. Memory is good.  PSYCHIATRIC:  No significant confusion, agitation or depression noted.   LABORATORY, DIAGNOSTIC, AND RADIOLOGICAL DATA:  EKG showed sinus tach at 109 beats per minute, normal axis, possible left atrial enlargement  according to EKG criteria. Borderline EKG, no acute ST T changes were noted.   Potassium level of 2.9, otherwise BMP was unremarkable. Alcohol level was 0.072%. Cardiac enzymes x 2 were within normal limits. Urine drug screen was positive for cocaine as well as cannabinoids. The patient's white blood cell count was elevated to 12.9, hemoglobin was 16.1, platelet count was 334. Coagulation panel was not checked. Tylenol level less than 2.0, and salicylate level was 4.2.   ASSESSMENT AND PLAN:  1.  Chest pains. Admit patient to medical floor. Start him on aspirin, follow cardiac enzymes x 3. Get cardiologist evaluation and get echocardiogram done.  2.  Polysubstance abuse. Get psychiatrist involved again. The patient would benefit from psychiatry followup as outpatient and he agreeable for that.  3.  Hypokalemia, check magnesium level, supplement p.o.  4.  Elevated white blood cell count, possibly stress related, follow with therapy.  5.  Tobacco abuse. Discussed cessation for 5 minutes. Nicotine replacement therapy will be initiated.   TIME SPENT:  50 minutes on this patient.   ____________________________ Katharina Caper, MD rv:jm D: 03/21/2013 09:51:01 ET T: 03/21/2013 10:59:48 ET JOB#: [id number]  cc: Katharina Caper, MD, <Dictator> Primary care physician  Maleta Pacha MD ELECTRONICALLY SIGNED 04/01/2013 15:19

## 2014-08-13 NOTE — Consult Note (Signed)
PATIENT NAME:  Michael [name], Michael [name] [id number] OF BIRTH:  1990-09-23 OF ADMISSION:  11/16/2014OF CONSULTATION: 03/09/2013 PHYSICIAN: Dr. Sharma Covert PHYSICIAN: Kristine Linea, MD FOR CONSULTATION: To evaluate a suicidal patient.  DATA: Michael [name] is a 24 year old male with a history of mood instability.   COMPLAINT: "I cut myself."   OF PRESENT ILLNESS:  Michael [name] was brought to the ER by EMS with self inflicted left arm laceration. He cut himself while drunk on an anniversary of his father?s death. He was initially irritable in the ER and did not want to participate in intake interview. By the time I evaluated the patient, he was sober and cooperative. He adamantly denies suicidal or homicidal ideation. He denied any symptoms of depression, anxiety or psychosis. He denies symptoms suggestive of bipolar mania. He denies heavy drinking. He has been in the care of [facility] [place] where he is prescribed 8 mg of suboxone to take three times daily. His next visit is on 11/29. He has not been seeing a psychiatrist outside of [facility] lately but wants to return to Dr. Janeece Riggers in hopes to get benzodiazepines.  PSYCHIATRIC HISTORY:  The patient had multiple psychiatric admissions including extended, 8 month stay in the hospital in [place]. He has past diagnoses of bipolar, ADHD, PTSD and substance abuse. He used to cut but has not done it in 6 years. He attempted a suicide twice by overdose. He has been tried on numerous medications but believes that he only needs benzodiazepines. There is a history of polysubstance dependence. HISTORY OF MENTAL ILLNESS:  His biological mother and his brother have mental illness. No known history of suicides in the family. MEDICAL HISTORY: None.  Adderall, Haldol, Risperdal, Tramadol, Vicodin.  ON ADMISSION: Suboxone 8/2 mg three times daily, Xanax. HISTORY: The patient is adopted. He dropped out in the 10th grade, no GED. He has been incarcerated. He lives with his  girlfriend.   OF SYSTEMS:No fevers or chills. No weight changes. No double or blurred vision. No hearing loss. No shortness of breath or cough. No chest pain or orthopnea. No abdominal pain, nausea, vomiting, or diarrhea. No incontinence or frequency. No heat or cold intolerance. No anemia or easy bruising. Cut on the arm.    No muscle or joint pain. No tingling or weakness.   See history of present illness for details EXAMINATION:SIGNS: Temperature 96.8, heart rate 82, blood pressure 98/54, respirations 18.  This is a well developed male in no acute distress. rest of the physical examination is deferred to his primary attending.   DATA: Chemistries are within normal limits except K 3.3. Blood alcohol level 0.135. LFTs within normal limits. TSH 1.28. Urine tox screen is positive for cocaine. CBC within normal limits. Urinalysis is not suggestive for UTI.  STATUS EXAMINATION: The patient is alert and oriented to person, place, time and situation. He is pleasant, polite and cooperative. He maintains good eye contact. He is well groomed wearing hospital scrubs. His mood is "fine" with full affect. Thought process is logical. He denies suicidal or homicidal ideation. There are no delusions, paranoia, or hallucinations. His cognition is grossly intact. His insight and judgment are fair.  I:  Alcohol dependence.    Cocaine dependence.   Opioid dependence.II:  Deferred. III:  Deferred. IV:  Substance abuse. V:  Global assessment of functioning 50.   The patient no longer meets criteria for IVC. I will terminate proceedings. Please discharge as appropriate. He will follow up with [facility]  in [place] on 11/29. He will follow up with Dr. Janeece [facility]gersSu on 11/20 at 8:15 am.   Electronic Signatures: Kristine LineaPucilowska, Emery Binz (MD)  (Signed on 20-Nov-14 23:31)  Authored  Last Updated: 20-Nov-14 23:31 by Kristine LineaPucilowska, Hiran Leard (MD)
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
## 2014-08-13 NOTE — Consult Note (Signed)
Brief Consult Note: Diagnosis: Polysubstance dependence.   Patient was seen by consultant.   Consult note dictated.   Recommend further assessment or treatment.   Orders entered.   Comments: [name] has a h/o schizoaffcetgive disorder off medications and polysubstance dependence. He has been off suboxone for the past 2 weeks and missed his appointment at [facility] in [place] today. It is unclear if he kept his appointment with Dr. Janeece [name] om 11/20. He was admitted with chest pain after cocaine binge.    PLAN: 1. The patient agrees to come to psychiatry for treatment of bipolar and substance abuse. Please transfer.  Electronic Signatures: Kristine LineaPucilowska, Pradyun Ishman (MD)  (Signed [phone]-Nov-14 15:43)  Authored: Brief Consult Note   Last Updated: 29-Nov-14 15:43 by Kristine LineaPucilowska, Tymber Stallings (MD)

## 2014-08-13 NOTE — Discharge Summary (Signed)
PATIENT NAME:  Michael LevanSMITH, Dashiel MR#:  [id number] DATE OF BIRTH:  16-Jul-1990  DATE OF ADMISSION:  03/21/2013  DATE OF DISCHARGE:  03/21/2013  ADMITTING DIAGNOSIS:  Chest pain.  DISCHARGE DIAGNOSES:   1.  Chest pain, no myocardial infarction. 2.  Polysubstance abuse. 3.  Hypokalemia. 4.  Leukocytosis. 5.  Tobacco abuse.   DISCHARGE CONDITION: Stable.   DISCHARGE MEDICATIONS: The patient is to start aspirin 81 mg p.o. daily, potassium chloride 40 mEq p.o. twice daily, nicotine oral inhaler 1 inhalation every 1 to 2 hours as needed, nicotine transdermal patch 21 mg topically daily.   INSTRUCTIONS: Home oxygen: None. Diet: 2 gram sodium, low-fat, low-cholesterol, regular consistency. Activity limitations as tolerated. Followup appointment with Dr. Mariah MillingGollan, Cardiology, 2 days after discharge. Mental health center in [place] 2 days after discharge. He is to follow up with Dr. Janeece RiggersSu at the next scheduled appointment.   CONSULTANTS: Care Management, social worker, Dr. Jennet MaduroPucilowska, psychiatrist, and Dr. Mariah MillingGollan, Cardiology.   RADIOLOGIC STUDIES: Chest x-ray PA and lateral, 03/21/2013, revealed no acute cardiopulmonary disease. CT scan of head without contrast, 03/21/2013, revealed normal head CT with no acute intracranial process.   REASON FOR ADMISSION: The patient is a 24 year old Caucasian male with history of polysubstance abuse, also psychiatric history, who presented to the hospital after a cocaine binge on 03/21/2013. Please refer to Dr. Arlys JohnVaickute's admission note on 03/21/2013. On arrival to the hospital, patient was afebrile, heart rate was in the 70s to 90s, respiratory rate was in the 20s, his blood pressure was normal at 130s/70s, O2 sat was 97% on room air. Physical examination was unremarkable. The patient's EKG showed normal sinus rhythm, with no acute ST-T changes.   LAB DATA: Done in the Emergency Room revealed a low potassium level of 2.9 and magnesium level of 2.5. Alcohol level was 0.072%.  Cardiac enzymes, first set as well as 2 more sets, were within normal limits. Urine drug screen was positive for cocaine as well as cannabinoids. White blood cell count was mildly elevated to 12.9, hemoglobin was 16.1, platelet count 334. Acetaminophen level was less than 2.0, salicylate level was 4.2.   HOSPITAL COURSE: The patient was admitted to the hospital for further evaluation. Cardiology consultation with Dr. Mariah MillingGollan was obtained. Dr. Mariah MillingGollan saw patient in consultation. He discussed with patient risks of cocaine abuse. He felt that it was likely a coronary spasm. However, it was atypical in nature, and he recommended cocaine cessation. He also recommended to discharge patient from cardiac perspective. In regards to polysubstance abuse, Psychiatry was counseled, and Dr. Jennet MaduroPucilowska saw patient in consultation the same day. Dr. Jennet MaduroPucilowska, while talking to the patient, felt that he would benefit from psychiatry inpatient treatment. However, patient, while initially accepted this treatment, later on he decided against it. Dr. Jennet MaduroPucilowska felt it would be okay to discharge him, as patient would follow up with [facility] in [place], as well as Dr. Janeece RiggersSu. She did not recommend any other therapies at this time.   On day of discharge, the patient talked with his family, did not complain of any significant discomfort. His vitals were stable, temperature of 98.4, pulse was 93, respiratory rate was 20, blood pressure 130/68, saturation was 97% on room air. He had no residual pains in his chest.   TIME SPENT:  40 minutes.     ____________________________ Katharina Caperima Tion Tse, MD rv:mr D: 03/21/2013 17:05:00 ET T: 03/21/2013 20:00:03 ET JOB#: [id number]  cc: Lynett FishHansen Su, MD Katharina Caperima Geanie Pacifico, MD, <Dictator>  [facility], [facility] Tyjae Issa MD ELECTRONICALLY SIGNED 04/01/2013 15:28

## 2014-08-13 NOTE — Consult Note (Signed)
General Aspect 24 year old Caucasian male with past medical history significant for a history of psychiatric disease, ADHD, bipolar disorder and polysubstance abuse, who presents to the hospital with complaints of chest pains, recent cocaine use.  He recently broke up with his girlfriend (she uses dugs etoh), and used ETOH, MAJ, cocaine. and around 3 a.m. he started having left-sided chest pain. The pain was described as sharp pain, came on slowly and progressively worsening, and then became constant, radiating to left side of the body with some left-sided tingling, numbness of arm and leg. He had shortness of breath, palpitations, lightheadedness. He reports recent  headaches on the right side of his head towards the back. Because of this discomfort, he decided to come to Emergency Room for further evaluation.   In the Emergency Room, he was noted to have urine drug screen which was positive for cocaine, and hospitalist services were contacted for admission. The patient admitted to snorting cocaine at around 7:00 p.m. last night.   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   Present Illness . MEDICATIONS:  According to the patient, the patient was supposed to be on paroxetine, lithium, trazodone, Geodon, Neurontin as well as Zyprexa; however, he is not taking any medications. In the past, he was also on Suboxone because of opiate dependence as well as Xanax. Not taking this as well.   PAST SURGICAL HISTORY:  Tonsillectomy and adenoidectomy, also left elbow operation, which left him with limited range of motion.   ALLERGIES:  HALDOL, VICODIN AS WELL AS RISPERDAL.   FAMILY HISTORY:  Hypertension as well as coronary artery disease. No stroke, diabetes or cancers. He is adopted.   SOCIAL HISTORY:  The patient is single, has no children. Smokes approximately 2 packs a day since the age of 63. Also does cocaine intermittently and marijuana. In the past, he was addicted to opiates, denies any opiate  use recently. Drinks alcohol occasionally socially; yesterday, however, he drank at least 6 or 7 shots of vodka.   Physical Exam:  GEN well developed, well nourished, no acute distress   HEENT PERRL, hearing intact to voice   NECK supple   RESP normal resp effort  clear BS   CARD Regular rate and rhythm  No murmur   ABD denies tenderness  soft   LYMPH negative neck   EXTR negative edema   SKIN normal to palpation   NEURO motor/sensory function intact   PSYCH alert, A+O to time, place, person, good insight   Review of Systems:  Subjective/Chief Complaint left arm adn leg tingling, headache on the right side of the head   General: Fatigue   Skin: No Complaints   ENT: No Complaints   Eyes: No Complaints   Neck: No Complaints   Cardiovascular: Chest pain or discomfort  resolved   Gastrointestinal: No Complaints   Genitourinary: No Complaints   Vascular: No Complaints   Musculoskeletal: No Complaints   Neurologic: No Complaints   Hematologic: No Complaints   Endocrine: No Complaints   Psychiatric: No Complaints   Review of Systems: All other systems were reviewed and found to be negative   Medications/Allergies Reviewed Medications/Allergies reviewed     Ablation - Heart Cath:    Drug abuse:    ADHD:    denies surg hx:        Admit Diagnosis:   CHEST PAIN: Onset Date: 21-Mar-2013, Status: Active, Description: CHEST PAIN  Lab Results:  General Ref:  29-Nov-14 04:57  Acetaminophen, Serum < 2.0 (10-30 POTENTIALLY TOXIC:  > 200 mcg/mL  > 50 mcg/mL at 12 hr after  ingestion  > 300 mcg/mL at 4 hr after  ingestion)  Salicylates, Serum  4.2 (0.0-2.8 Therapeutic 2.8-20.0 mg/dL Toxic >30.0 mg/dL)  Routine Chem:  29-Nov-14 04:57   Magnesium, Serum  2.5 (1.8-2.4 THERAPEUTIC RANGE: 4-7 mg/dL TOXIC: > 10 mg/dL  -----------------------)  Ethanol, S. 72  Ethanol % (comp) 0.072 (Result(s) reported on 21 Mar 2013 at 05:24AM.)  Glucose, Serum  86  BUN 16  Creatinine (comp) 1.02  Sodium, Serum 139  Potassium, Serum  2.9  Chloride, Serum  108  CO2, Serum 22  Calcium (Total), Serum 9.6  Anion Gap 9  Osmolality (calc) 278  eGFR (African American) >60  eGFR (Non-African American) >60 (eGFR values <75m/min/1.73 m2 may be an indication of chronic kidney disease (CKD). Calculated eGFR is useful in patients with stable renal function. The eGFR calculation will not be reliable in acutely ill patients when serum creatinine is changing rapidly. It is not useful in  patients on dialysis. The eGFR calculation may not be applicable to patients at the low and high extremes of body sizes, pregnant women, and vegetarians.)  Cardiac:  29-Nov-14 04:57   Troponin I < 0.02 (0.00-0.05 0.05 ng/mL or less: NEGATIVE  Repeat testing in 3-6 hrs  if clinically indicated. >0.05 ng/mL: POTENTIAL  MYOCARDIAL INJURY. Repeat  testing in 3-6 hrs if  clinically indicated. NOTE: An increase or decrease  of 30% or more on serial  testing suggests a  clinically important change)  CK, Total 130  CPK-MB, Serum 2.1 (Result(s) reported on 21 Mar 2013 at 09:05AM.)  Routine Hem:  29-Nov-14 04:57   WBC (CBC)  12.9  RBC (CBC) 5.21  Hemoglobin (CBC) 16.1  Hematocrit (CBC) 47.1  Platelet Count (CBC) 334 (Result(s) reported on 21 Mar 2013 at 05:08AM.)  MCV 90  MCH 30.8  MCHC 34.1  RDW 14.3   EKG:  Interpretation EKG shows NSR with rate 109 bpm   Radiology Results: XRay:    29-Nov-14 05:24, Chest PA and Lateral  Chest PA and Lateral   REASON FOR EXAM:    chest pain  -  ed waiting room  COMMENTS:   May transport without cardiac monitor    PROCEDURE: DXR - DXR CHEST PA (OR AP) AND LATERAL  - Mar 21 2013  5:24AM     CLINICAL DATA:  Patient states left side of the body is tingling.  Cocaine use and chest pain today.    EXAM:  CHEST  2 VIEW    COMPARISON:  None.    FINDINGS:  The heart size and mediastinal contours are within normal  limits.  Both lungs are clear. The visualized skeletal structures are  unremarkable.     IMPRESSION:  No active cardiopulmonary disease.      Electronically Signed    By: WLucienne CapersM.D.    On: 03/21/2013 05:36         Verified By: WNeale Burly M.D.,    Soap: Hives, Itching, Rash  Risperdal: Anaphylaxis, Alt Ment Status  Adderall: Swelling  Tramadol: Hives  Vicodin: N/V/Diarrhea  Haldol: Other  Vital Signs/Nurse's Notes: **Vital Signs.:   29-Nov-14 10:04  Vital Signs Type Admission  Temperature Temperature (F) 98.4  Celsius 36.8  Temperature Source oral  Pulse Pulse 93  Respirations Respirations 20  Systolic BP Systolic BP 1026 Diastolic BP (mmHg) Diastolic BP (mmHg) 68  Mean BP 88  Pulse Ox % Pulse Ox % 97  Pulse Ox Activity Level  At rest  Oxygen Delivery Room Air/ 21 %    Impression ASSESSMENT AND PLAN:  1.  Chest pains.  Recent cocaine use, possibel coronary spasm pain is atypical in nature Biggest compaint is left arma and leg tingling no further cardiac workup needed at this time Recommended cocaine cessation ok to d/c from cardiac perspective  2.  Polysubstance abuse.   psychiatrist consulted Recent problems with relationship to girfriend whop comes and goes, abuses etoh,drugs  psychiatry followup as outpatient  3.  Hypokalemia,   supplement p.o. and IV  4.  Elevated white blood cell count, stress related  5.  Tobacco abuse.  Discussed cessation >10 min,  on a ptch, Nicotine replacement therapy initiated by hospitalist   Electronic Signatures: Ida Rogue (MD)  (Signed 29-Nov-14 14:33)  Authored: General Aspect/Present Illness, History and Physical Exam, Review of System, Past Medical History, Health Issues, Home Medications, Labs, EKG , Radiology, Allergies, Vital Signs/Nurse's Notes, Impression/Plan   Last Updated: 29-Nov-14 14:33 by Ida Rogue (MD)

## 2014-08-13 NOTE — H&P (Signed)
PATIENT NAME:  Michael [name], DECOCK MR#:  [id number] DATE OF BIRTH:  [birth year]  DATE OF ADMISSION:  03/29/2013  DATE OF BIRTH: [birth year].   IDENTIFYING INFORMATION AND CHIEF COMPLAINT: A 24 year old man brought himself to the Emergency Room.   CHIEF COMPLAINT: "I need some detox."   HISTORY OF PRESENT ILLNESS: Information obtained from the patient and the chart. The patient presented himself to the Emergency Room stating he needed treatment for substance abuse, including detox and rehab treatment. He says that he wants to go to a rehab facility, but hates the alcohol and drug abuse treatment center in [place] and does not want to go there. He does not have any other particular idea in mind. He has been using marijuana cocaine, alcohol and Suboxone supposedly. He wants to get off of all of it. He thinks that cocaine is the one causing the biggest problem for him. He has been feeling depressed and down, his energy level has been low, his sleep has been poor, his social activity has been poor. He has not been taking any psychiatric medicine or staying active in any psychiatric treatment. We discover that also that apparently he has some legal charges pending.   PAST PSYCHIATRIC HISTORY: The patient has had several prior admissions for both medical and psychiatric concerns. He mostly has had admissions for substance abuse. His treatment of the past has been usually brief detox and referral to substance abuse treatment, although it does not seem that he follows up very well with outpatient treatment. He does claim that he has managed to have extended sobriety in the past. He has been variously diagnosed as bipolar and personality disorder in the past. He tells me that he thinks that lithium and Xanax were particularly effective. He has a tendency to focus on wanting to take benzodiazepines therapeutically. He denies any past history of actual suicide attempts in the past, and denies any history of violence.    FAMILY HISTORY: Denies family history of mental health problems.   SOCIAL HISTORY: The patient lives by himself,  gets SSI. Has limited social life. Has significant legal problems past and present.   PAST MEDICAL HISTORY: He has presented in the past with chest pain but has ruled out for any kind of cardiac problem. Really no significant ongoing medical issues.   CURRENT MEDICATIONS: None.   ALLERGIES: MULTIPLE INCLUDING ADDERALL, HALDOL, RISPERDAL, TRAMADOL VICODIN AND SOAP.   REVIEW OF SYSTEMS: Mood is depressed. Thoughts are negative, energy is low. Sleep is poor. No suicidal ideation. No hallucinations. No delusions.   MENTAL STATUS EXAMINATION: A 24 year old man who appears to be in no acute distress, but is tired out. Eye contact poor. Psychomotor activity sluggish. Speech slow, but understandable. Affect dysphoric and irritated. Mood stated as being depressed. Thoughts appear lucid. No evidence of loosening of associations or delusions. Denies auditory or visual hallucinations. Denies suicidal or homicidal ideation. Judgment and insight chronically impaired. Intelligence average. Alert and oriented x 4.   PHYSICAL EXAMINATION: GENERAL: Appears to be in no acute distress. Multiple tattoos, but no acute skin lesions.  HEENT: Pupils equal and reactive. Face symmetric.  MUSCULOSKELETAL: Full range of motion at all extremities. Gait normal. Strength and reflexes normal and symmetric throughout. Cranial nerves symmetric and normal.  LUNGS: Clear to auscultation with no wheezes.  HEART: Regular rate and rhythm.  ABDOMEN: Soft, nontender, normal bowel sounds.  VITAL SIGNS: Most recent include a temperature of 96.7, pulse 89, respirations 16, blood pressure 131/62.   LABORATORY RESULTS:  Drug screen positive for cocaine and cannabis. The potassium is slightly low at 3.4. Alcohol level when he came into the Emergency Room was 167. Hematology panel showed just a slight elevation in white count  at 11.1. His urinalysis is all normal.   ASSESSMENT: A 24 year old man with polysubstance dependence with a history of mood problems with various diagnoses,  but currently does not seem to be psychotic, nor manic. He has a personality disorder features and problems with follow-through. He is not reporting suicidal ideation, but is reporting of desperation at his substance abuse and a desire to get involved in rehab treatment.   TREATMENT PLAN: Admit to psychiatry. Work on options for referral. Start him on lithium. Avoid benzodiazepines, not likely to need any specific detox treatment. Try and get in engaged in substance abuse programming.   DIAGNOSIS, PRINCIPAL AND PRIMARY:   AXIS I: Polysubstance dependence.   SECONDARY DIAGNOSES: AXIS I: Bipolar disorder, not otherwise specified.   AXIS II: Antisocial personality disorder.   AXIS III: No diagnosis.   AXIS IV: Moderate from chronic substance use problems.   AXIS V: Functioning at time of evaluation is 35.     ____________________________ Audery AmelJohn T. Rishan Oyama, MD jtc:cc D: 03/29/2013 22:22:14 ET T: 03/29/2013 23:24:05 ET JOB#: [id number]  cc: Audery AmelJohn T. Srihari Shellhammer, MD, <Dictator> Audery AmelJOHN T Harmoney Sienkiewicz MD ELECTRONICALLY SIGNED 03/30/2013 12:40

## 2014-08-13 NOTE — Discharge Summary (Signed)
PATIENT NAME:  Michael [name], Michael [name] MR#:  [id number] DATE OF BIRTH:  August 18, 1990  DATE OF ADMISSION:  03/29/2013 DATE OF DISCHARGE:  03/31/2013  HOSPITAL COURSE: See dictated history and physical for details of admission. A 24 year old man admitted for substance abuse treatment with anxiety symptoms. In the hospital, he was passively cooperative with substance abuse programming. Complained of anxiety. Did not engage in any dangerous or suicidal behavior. Did not appear to be psychotic. Denied suicidal ideation. The patient was insistent on going through substance abuse rehab treatment other than the alcohol and drug abuse treatment center. He determined that this would be difficult to obtain from the inpatient situation and decided he would prefer to be discharged. At the time of discharge, he was not voicing any dangerous plans or ideation. He agreed to follow up with [facility] in the community. He had been treated with medication in the hospital, including resumption of lithium and quetiapine which had previously been effective for him and had tolerated them well. He was to continue these medicines at discharge.   DISCHARGE MEDICINES: Lithium 900 mg p.o. at bedtime, quetiapine 100 mg p.o. at bedtime.   LABORATORY RESULTS: Admission labs included an alcohol level of 167. Chemistry panel unremarkable except for a slightly low potassium at 3.4. Drug screen positive for cocaine and cannabis. CBC unremarkable.   MENTAL STATUS EXAM AT DISCHARGE: Casually dressed, neatly groomed man, looks his stated age, cooperative with the interview. Eye contact good. Psychomotor activity normal. Speech normal rate, tone and volume. Affect euthymic, mildly anxious, reactive, appropriate. Mood stated as okay. Thoughts lucid. No evidence of loosening of associations or delusions. Denies auditory or visual hallucinations. Denies suicidal or homicidal ideation. Adequate judgment and insight. Normal intelligence. Alert and oriented x 4.    DIAGNOSIS, PRINCIPAL AND PRIMARY:  AXIS I: Polysubstance dependence.   SECONDARY DIAGNOSES:  AXIS I: Mood disorder, not otherwise specified.  AXIS II: Antisocial personality disorder.  AXIS III: No diagnosis.  AXIS IV: Severe from legal problems.  AXIS V: Functioning at time of discharge 53.    ____________________________ Audery AmelJohn T. Clapacs, MD jtc:gb D: 03/31/2013 22:02:58 ET T: 03/31/2013 22:12:08 ET JOB#: [id number]  cc: Audery AmelJohn T. Clapacs, MD, <Dictator> Audery AmelJOHN T CLAPACS MD ELECTRONICALLY SIGNED 03/31/2013 23:28

## 2014-08-13 NOTE — Consult Note (Signed)
Brief Consult Note: Diagnosis: Alcohol intoxication, Alcohol abuse, Opioid dependence.   Patient was seen by consultant.   Consult note dictated.   Recommend further assessment or treatment.   Comments: [name] has a h/o schizoaffcetgive disorder off medications and polysubstance dependence currently in the suboxone clinic. He was brought to the ER after cutting his arm while drunk. He is no longer drunk, suicidal or homicidal.   PLAN: 1. The patient no longer meets criteria for IVC. I will teminate proceedings. Please discharge as appropriate.  2. He will follow up with [facility] in [place] on 11/29.  3. He will follow up with Dr. Janeece [name] on 11/20 at 8:15 am.  Electronic Signatures: Michael [name], Michael [name] (MD)  (Signed 17-Nov-14 13:00)  Authored: Brief Consult Note   Last Updated: 17-Nov-14 13:00 by Michael [name], Michael [name] (MD)

## 2014-08-14 NOTE — Discharge Summary (Signed)
PATIENT NAME:  Michael [name], [name] MR#:  [id number] DATE OF BIRTH:  10-16-1990  DATE OF ADMISSION:  06/03/2013 DATE OF DISCHARGE:  06/08/2013  HOSPITAL COURSE: See dictated history and physical for details of admission. This 24 year old man was admitted to the hospital intoxicated, agitated and making suicidal statements, lots of mood lability and agitation. In the hospital, he remained agitated and labile. He has a history of antisocial behavior, possible bipolar disorder. He did not require specific detox treatment, but was treated with his previous medication, which was a combination of Zyprexa and Thorazine and gabapentin. The patient became agitated and hostile during part of his hospital stay when he was thwarted and substance abuse issues were brought up. The plan for much of his hospital stay was that he would stay in the hospital until he could be discharged to his court hearing, at which it was said that he would be admitted to the DART program through the [facility] [employer]. Weather, however, appeared to be likely to effect and the possibility of discharge. His mother also was strongly requesting his discharge from the hospital. After restarting some of his medication, he appeared to calm down and was no longer making homicidal or suicidal threats. He was discharged, therefore, on the 16th in a calmer frame of mind with the plan that he would continue his medication, but would be admitted to the DART program on the following Thursday.   DISCHARGE MEDICATIONS: Gabapentin 300 mg 3 times a day, Thorazine 50 mg every 8 hours, Zyprexa 5 mg at midday and 10 mg at night.   MENTAL STATUS EXAMINATION AT DISCHARGE: Casually dressed, adequately groomed man, looks his stated age, cooperative with the interview. Eye contact good. Psychomotor activity calm. Psychomotor activity normal. Speech normal rate, tone and volume. Affect blunted. Mood stated as okay. Thoughts are lucid. No loosening of associations. No  evidence of delusions. Denies auditory or visual hallucinations. Denies suicidal or homicidal ideation. Improved insight and judgment. Normal intelligence. Short-term memory intact. Three out of three objects immediately and at 3 minutes. Long-term memory grossly intact. Alert and oriented x 4.   LABORATORY RESULTS: Data on admission included a drug screen positive for cocaine, amphetamines, cannabis. TSH slightly low at 0.38.  Alcohol level 61. Chemistry profile  otherwise unremarkable. CBC unremarkable. Urinalysis normal.   DISPOSITION: He is to be discharged home with his mother and will be admitted to the DART program the following Thursday.   DIAGNOSIS, PRINCIPAL AND PRIMARY:  AXIS I: Bipolar disorder, not otherwise specified, mixed state.   SECONDARY DIAGNOSES: AXIS I:  1.   Substance-induced mood disorder.  2. Alcohol abuse, cannabis abuse, cocaine abuse, amphetamine abuse, polysubstance dependence.  AXIS II:  Antisocial personality disorder.  AXIS III:  No diagnosis.  AXIS IV: Severe from legal problems.  AXIS V:  Functioning at time of discharge 53.    ____________________________ Michael AmelJohn T. Clapacs, MD jtc:dmm D: 06/22/2013 23:09:04 ET T: 06/23/2013 11:19:13 ET JOB#: [id number]  cc: Michael AmelJohn T. Clapacs, MD, <Dictator> Michael AmelJOHN T CLAPACS MD ELECTRONICALLY SIGNED 06/25/2013 0:39

## 2014-08-14 NOTE — Consult Note (Signed)
PATIENT NAME:  Michael LevanSMITH, Cong MR#:  [id number] DATE OF BIRTH:  Nov 11, 1990  DATE OF CONSULTATION:  06/14/2013  REFERRING PHYSICIAN:   CONSULTING PHYSICIAN:  Jezabel Lecker K. Guss Bundehalla, MD  DATE OF DICTATION: 06/14/2013   PLACE OF DICTATION: [facility], [place]   AGE: 24 years   SEX: Male   RACE: White   SUBJECTIVE: The patient is a 24 year old white male, not employed, not married and lives with his mother. The patient has a long history of mental illness and is being followed by Dr. Toni Amendlapacs on an outpatient basis. The patient comes for one of his several trips to the Emergency Room at [facility] with a chief complaint "I overdosed on pills. I had suicidal thoughts."   HISTORY OF PRESENT ILLNESS: The patient reports that he overdosed himself on prescription medications which are Thorazine, gabapentin and Zyprexa. The patient was seen by Dr. Toni Amendlapacs a few days ago.   ALCOHOL AND DRUGS: Admits that he has been using cocaine.   MENTAL STATUS: The patient is alert and oriented, calm, pleasant and cooperative. Fully aware of the situation that brought him here. Affect is appropriate with his mood which is low and down. Denies feeling hopeless or helpless and said "not now." Denies feeling worthless or useless and said "not now." Does not appear to be responding to any internal stimuli. Denies auditory or visual hallucinations. Admits that he did have suicidal wishes when he came to the hospital but currently contracts for safety. Insight and judgment guarded. According to information obtained from the staff, the patient had 3 felony charges, and he is trying to avoid going and taking responsibility and he comes to the Emergency Room because of the same.   IMPRESSION: Schizoaffective disorder, bipolar according to the history; antisocial personality with behavior problems.   RECOMMENDATION: The patient should be seen by Mr. Theodosia PalingKent Harbuck on 06/15/2013 and discuss with Dr. Toni Amendlapacs as for  disposition of the patient as he contracts for safety for now.   ____________________________ Jannet MantisSurya K. Guss Bundehalla, MD skc:gb D: 06/14/2013 21:23:07 ET T: 06/15/2013 02:57:45 ET JOB#: [id number]  cc: Monika SalkSurya K. Guss Bundehalla, MD, <Dictator> Beau FannySURYA K Damier Disano MD ELECTRONICALLY SIGNED 06/20/2013 22:59

## 2014-08-14 NOTE — Consult Note (Signed)
Brief Consult Note: Diagnosis: Polysubstance dependence.   Patient was seen by consultant.   Consult note dictated.   Recommend further assessment or treatment.   Orders entered.   Comments: [name] has a h/o substance dependence. He was just discharged from psychiatry. He came to ER after overdose in the context of drug use. There are multiple legal problems and the patient is court ordered participation in substance abuse program. He has appointment with his probation officer tomorrow.   PLAN: 1. The patient is on IVC.   2. The patient will be discharged tomorrow with his mother to meet with probation officer.  3. No medications recommended at this point. He did not need detox. He overdosed on prescribed medications.   4. Psychiatry will follow along.  Electronic Signatures: Kristine LineaPucilowska, Eirik Schueler (MD)  (Signed 23-Feb-15 17:04)  Authored: Brief Consult Note   Last Updated: 23-Feb-15 17:04 by Kristine LineaPucilowska, Sydney Hasten (MD)

## 2014-08-14 NOTE — H&P (Signed)
PATIENT NAME:  Michael [name], Michael [name] MR#:  [id number] DATE OF BIRTH:  11-Jun-1990  DATE OF ADMISSION:  06/03/2013  IDENTIFYING INFORMATION AND CHIEF COMPLAINT: A 24 year old man with a history of substance dependence and mood disorder, who presented to the Emergency Room with a chief complaint of being suicidal and abusing substances. His chief complaint to me today  is, "I drank too much."   HISTORY OF PRESENT ILLNESS: Information obtained from the patient and the chart. He came to the Emergency Room last night and reported that he was having suicidal thoughts. He tells me today that he had been threatening to stab himself with a knife and only stopped because his mother intervened. His mood has been feeling up and down, but more depressed than not recently. A lot of the stress comes from his ongoing substance abuse as well as his failure at any kind of social stability. His sleep patterns are poor. His appetite is erratic. He does not describe any other specific physical symptoms. He has been having more frequent suicidal thoughts. Denies homicidal thoughts. Denied hallucinations. He is drinking alcohol in amounts up to a fifth of liquor every day and also using cocaine, marijuana and opiates. He says he has been "in and out of detox" at [facility] over the last couple of weeks, evidently without any sustained sobriety.   PAST PSYCHIATRIC HISTORY: The patient has had multiple admissions for detox and substance abuse in the past. At times, he has been diagnosed as bipolar and has been treated with antipsychotic mood stabilizers in the past. He said at one point to me that he thought that lithium was helpful. I have treated him with lithium and Seroquel the past. He did not follow up with that. More recently, apparently he has been getting Zyprexa. It is not really clear that anything has made a difference when he has been abusing substances. He denies that he has ever seriously tried to kill himself, denies a  history of violence.   SOCIAL HISTORY: Not married. Not working. Has multiple legal charges and has a court date scheduled for tomorrow. Has been staying with his mother. Mother has already called and left a message for [place], saying she thinks he needs to stay in the hospital.   FAMILY HISTORY: Positive for some substance abuse.   PAST MEDICAL HISTORY: Denies any significant ongoing medical problems.   MEDICATIONS: Currently none.   ALLERGIES: HALDOL, RISPERDAL, TRAMADOL, VICODIN AND SOAP.   REVIEW OF SYSTEMS: Reports depressed mood and anxiety, all getting worse recently. Poor sleep. Poor appetite. Recent suicidal thoughts, now decreased. Denies auditory or visual hallucinations. Denies panic attacks. He denies nausea or vomiting. Denies pulmonary symptoms. Denies cardiac symptoms. The rest of the full review of systems is negative.   MENTAL STATUS EXAMINATION:  A somewhat disheveled man who looks his stated age. Passively cooperative with the interview. Eye contact intermittent. Psychomotor activity slow. Speech decreased in total amount. Affect flat, irritable. Mood stated as being fine. Thoughts are generally lucid without loosening of associations or delusions. Denies auditory or visual hallucinations. Denies suicidal or homicidal ideation. Shows poor judgment and insight,  probably average intelligence.   PHYSICAL EXAMINATION: SKIN: No acute skin lesions. Multiple old scars.  HEENT: Pupils equal and reactive. Face symmetric. Oral mucosa dry.  NECK AND BACK: Nontender.  NEUROLOGICAL: Full range of motion at all extremities. Gait normal. Strength and reflexes symmetric and normal throughout.  LUNGS: Clear with no wheezes.  HEART: Regular rate and rhythm.  ABDOMEN:  Soft, nontender, normal bowel sounds.  VITAL SIGNS: Most recent in the Emergency Room included blood pressure 96/60, pulse 107, temperature 98.2.   LABORATORY RESULTS: Drug screen positive for cocaine and cannabis. TSH low at  0.38. Alcohol level 61. Chemistry panel otherwise unremarkable.   DIAGNOSIS AND ASSESSMENT: A 24 year old man with suicidal threats, mood instability, heavy substance abuse, poor follow-through, poor treatment compliance. Needs hospitalization because of dangerousness to self.   TREATMENT PLAN: Monitor vital signs. Detox p.r.n. May not need specific alcohol withdrawal treatment but medications will be provided as needed. Engage him in groups and individual therapy. Clearly he needs longer term treatment as he has not been compliant with outpatient treatment and continues to be dangerous. I recommend that we refer him to the alcohol and drug abuse treatment center. He is not happy about this, but his insight and judgment about this are obviously poor. I have restarted him on some Seroquel, which may help with his anxiety and irritability in the meantime.    DIAGNOSIS, PRINCIPAL AND PRIMARY:  AXIS I: Substance-induced mood disorder, angry and depressed.   SECONDARY DIAGNOSES: AXIS I: Polysubstance dependence including alcohol, cocaine, marijuana and opiates.  AXIS II: Antisocial, borderline and histrionic personality disorder.  AXIS III: No acute diagnosis.  AXIS IV: Severe stress, ongoing substance abuse, homelessness.  AXIS V: Functioning at time of evaluation 23.   ____________________________ Michael AmelJohn T. Sherrye Puga, MD jtc:cs D: 06/03/2013 14:22:28 ET T: 06/03/2013 14:48:21 ET JOB#: [id number]  cc: Michael AmelJohn T. Saagar Tortorella, MD, <Dictator> Michael AmelJOHN T Dejha King MD ELECTRONICALLY SIGNED 06/03/2013 18:13

## 2014-11-30 ENCOUNTER — Emergency Department
Admission: EM | Admit: 2014-11-30 | Discharge: 2014-11-30 | Disposition: A | Discharge status: Home / Self Care | Payer: Medicaid Other | Attending: Emergency Medicine | Admitting: Emergency Medicine

## 2014-11-30 ENCOUNTER — Encounter: Payer: Self-pay | Admitting: *Deleted

## 2014-11-30 DIAGNOSIS — Z720 Tobacco use: Secondary | ICD-10-CM | POA: Insufficient documentation

## 2014-11-30 DIAGNOSIS — J069 Acute upper respiratory infection, unspecified: Secondary | ICD-10-CM

## 2014-11-30 NOTE — ED Notes (Signed)
Pt ambulatory to triage. Pt has a cough and congestion for 3 days.  Taking otc meds without relief.  cig smoker

## 2014-11-30 NOTE — Discharge Instructions (Signed)
Upper Respiratory Infection, Adult An upper respiratory infection (URI) is also sometimes known as the common cold. The upper respiratory tract includes the nose, sinuses, throat, trachea, and bronchi. Bronchi are the airways leading to the lungs. Most people improve within 1 week, but symptoms can last up to 2 weeks. A residual cough may last even longer.  CAUSES Many different viruses can infect the tissues lining the upper respiratory tract. The tissues become irritated and inflamed and often become very moist. Mucus production is also common. A cold is contagious. You can easily spread the virus to others by oral contact. This includes kissing, sharing a glass, coughing, or sneezing. Touching your mouth or nose and then touching a surface, which is then touched by another person, can also spread the virus. SYMPTOMS  Symptoms typically develop 1 to 3 days after you come in contact with a cold virus. Symptoms vary from person to person. They may include:  Runny nose.  Sneezing.  Nasal congestion.  Sinus irritation.  Sore throat.  Loss of voice (laryngitis).  Cough.  Fatigue.  Muscle aches.  Loss of appetite.  Headache.  Low-grade fever. DIAGNOSIS  You might diagnose your own cold based on familiar symptoms, since most people get a cold 2 to 3 times a year. Your caregiver can confirm this based on your exam. Most importantly, your caregiver can check that your symptoms are not due to another disease such as strep throat, sinusitis, pneumonia, asthma, or epiglottitis. Blood tests, throat tests, and X-rays are not necessary to diagnose a common cold, but they may sometimes be helpful in excluding other more serious diseases. Your caregiver will decide if any further tests are required. RISKS AND COMPLICATIONS  You may be at risk for a more severe case of the common cold if you smoke cigarettes, have chronic heart disease (such as heart failure) or lung disease (such as asthma), or if  you have a weakened immune system. The very young and very old are also at risk for more serious infections. Bacterial sinusitis, middle ear infections, and bacterial pneumonia can complicate the common cold. The common cold can worsen asthma and chronic obstructive pulmonary disease (COPD). Sometimes, these complications can require emergency medical care and may be life-threatening. PREVENTION  The best way to protect against getting a cold is to practice good hygiene. Avoid oral or hand contact with people with cold symptoms. Wash your hands often if contact occurs. There is no clear evidence that vitamin C, vitamin E, echinacea, or exercise reduces the chance of developing a cold. However, it is always recommended to get plenty of rest and practice good nutrition. TREATMENT  Treatment is directed at relieving symptoms. There is no cure. Antibiotics are not effective, because the infection is caused by a virus, not by bacteria. Treatment may include:  Increased fluid intake. Sports drinks offer valuable electrolytes, sugars, and fluids.  Breathing heated mist or steam (vaporizer or shower).  Eating chicken soup or other clear broths, and maintaining good nutrition.  Getting plenty of rest.  Using gargles or lozenges for comfort.  Controlling fevers with ibuprofen or acetaminophen as directed by your caregiver.  Increasing usage of your inhaler if you have asthma. Zinc gel and zinc lozenges, taken in the first 24 hours of the common cold, can shorten the duration and lessen the severity of symptoms. Pain medicines may help with fever, muscle aches, and throat pain. A variety of non-prescription medicines are available to treat congestion and runny nose. Your caregiver   can make recommendations and may suggest nasal or lung inhalers for other symptoms.  HOME CARE INSTRUCTIONS   Only take over-the-counter or prescription medicines for pain, discomfort, or fever as directed by your  caregiver.  Use a warm mist humidifier or inhale steam from a shower to increase air moisture. This may keep secretions moist and make it easier to breathe.  Drink enough water and fluids to keep your urine clear or pale yellow.  Rest as needed.  Return to work when your temperature has returned to normal or as your caregiver advises. You may need to stay home longer to avoid infecting others. You can also use a face mask and careful hand washing to prevent spread of the virus. SEEK MEDICAL CARE IF:   After the first few days, you feel you are getting worse rather than better.  You need your caregiver's advice about medicines to control symptoms.  You develop chills, worsening shortness of breath, or brown or red sputum. These may be signs of pneumonia.  You develop yellow or brown nasal discharge or pain in the face, especially when you bend forward. These may be signs of sinusitis.  You develop a fever, swollen neck glands, pain with swallowing, or white areas in the back of your throat. These may be signs of strep throat. SEEK IMMEDIATE MEDICAL CARE IF:   You have a fever.  You develop severe or persistent headache, ear pain, sinus pain, or chest pain.  You develop wheezing, a prolonged cough, cough up blood, or have a change in your usual mucus (if you have chronic lung disease).  You develop sore muscles or a stiff neck. Document Released: 10/03/2000 Document Revised: 07/02/2011 Document Reviewed: 07/15/2013 Exit[employer] Patient Information 2015 [employer]. This information is not intended to replace advice given to you by your health care provider. Make sure you discuss any questions you have with your health care provider.  

## 2014-11-30 NOTE — ED Provider Notes (Signed)
[facility] Emergency Department Provider Note  ____________________________________________  Time seen: Approximately 3:50 PM  I have reviewed the triage vital signs and the nursing notes.   HISTORY  Chief Complaint Cough    HPI Sheryl Towell is a 24 y.o. male  Presenting to the emergency room with 3 day history of cold symptoms.  He notes that he was had nasal congestion, productive cough and chills.  He has tried pseudafed, Tylenol PM, and Nyquil. He has noticed some nausea but has been able to eat without vomiting.  He has had decreased appetite but is still drinking plenty of fluids.  No fever that he knows of but has had chills.He notes chest pain with coughing and general decrease in energy.  Patient is a current every day smoker.   No past medical history on file.  There are no active problems to display for this patient.   No past surgical history on file.  No current outpatient prescriptions on file.  Allergies Risperidone and related; Haldol; and Lamictal  No family history on file.  Social History History  Substance Use Topics  . Smoking status: Current Every Day Smoker  . Smokeless tobacco: Not on file  . Alcohol Use: No    Review of Systems Constitutional: Positive for chills and decreased energy. Eyes: No visual changes. ENT: "fullness" of ears.  Runny nose Cardiovascular: Denies chest pain. Respiratory: Positive for productive cough Gastrointestinal: No abdominal pain.  Positive nausea. no vomiting.  No diarrhea.  No constipation. Genitourinary: Negative for dysuria. Musculoskeletal: Chest pain with cough Skin: Negative for rash. Neurological: Negative for headaches, focal weakness or numbness.  10-point ROS otherwise negative.  ____________________________________________   PHYSICAL EXAM:  VITAL SIGNS: ED Triage Vitals  Enc Vitals Group     BP 11/30/14 1529 133/71 mmHg     Pulse Rate 11/30/14 1529 86     Resp  11/30/14 1529 20     Temp 11/30/14 1529 97.8 F (36.6 C)     Temp Source 11/30/14 1529 Oral     SpO2 11/30/14 1529 98 %     Weight 11/30/14 1529 194 lb (87.998 kg)     Height 11/30/14 1529 6' (1.829 m)     Head Cir --      Peak Flow --      Pain Score 11/30/14 1531 6     Pain Loc --      Pain Edu? --      Excl. in GC? --     Constitutional: Alert and oriented. Well appearing and in no acute distress. Eyes: Conjunctivae are normal. PERRL. EOMI. Head: Atraumatic. TM not visualized due to cerumen. Nose:  Congestion/rhinnorhea present. Mouth/Throat: Mucous membranes are moist.  Oropharynx non-erythematous. Neck: No stridor.   Hematological/Lymphatic/Immunilogical: No cervical lymphadenopathy. Cardiovascular: Normal rate, regular rhythm. Grossly normal heart sounds.  Good peripheral circulation. Respiratory: Normal respiratory effort.  No retractions. Lungs CTAB. Deep breaths triggered cough. Gastrointestinal: Soft and nontender. No distention. No abdominal bruits. No CVA tenderness. Musculoskeletal: No lower extremity tenderness nor edema.  No joint effusions. Neurologic:  Normal speech and language. No gross focal neurologic deficits are appreciated. No gait instability. Skin:  Skin is warm, dry and intact. No rash noted. Psychiatric: Mood and affect are normal. Speech and behavior are normal.  ____________________________________________   LABS (all labs ordered are listed, but only abnormal results are displayed)  Labs Reviewed - No data to display ____________________________________________   PROCEDURES  Procedure(s) performed: None  Critical Care  performed: No  ____________________________________________   INITIAL IMPRESSION / ASSESSMENT AND PLAN / ED COURSE  Pertinent labs & imaging results that were available during my care of the patient were reviewed by me and considered in my medical decision making (see chart for details).  Patient presented to the  emergency department with 3 day history of URI symptoms.  Discussed URI presentation, timeline and symptomatic relief.  Continue using OTC cold relief products as needed.  Return to the ER if symptoms worsen, if fever develops, or if there is no improvement in 10 days.  Discussed importance of staying hydrated and hand washing.  Work note provided. ____________________________________________   FINAL CLINICAL IMPRESSION(S) / ED DIAGNOSES  Final diagnoses:  Acute URI      Evangeline Dakin, PA-C 11/30/14 1904  Sharyn Creamer, MD 11/30/14 [phone]

## 2015-02-20 IMAGING — CR LEFT LITTLE FINGER 2+V
1 series · 4 of 4 positions shown · non-contrast
Comparison: None.

CLINICAL DATA: Recent traumatic injury

EXAM:
LEFT LITTLE FINGER 2+V

[Series 1: pa · 0.17mm/px · 4 of 4 slices shown]
[im 1/4]
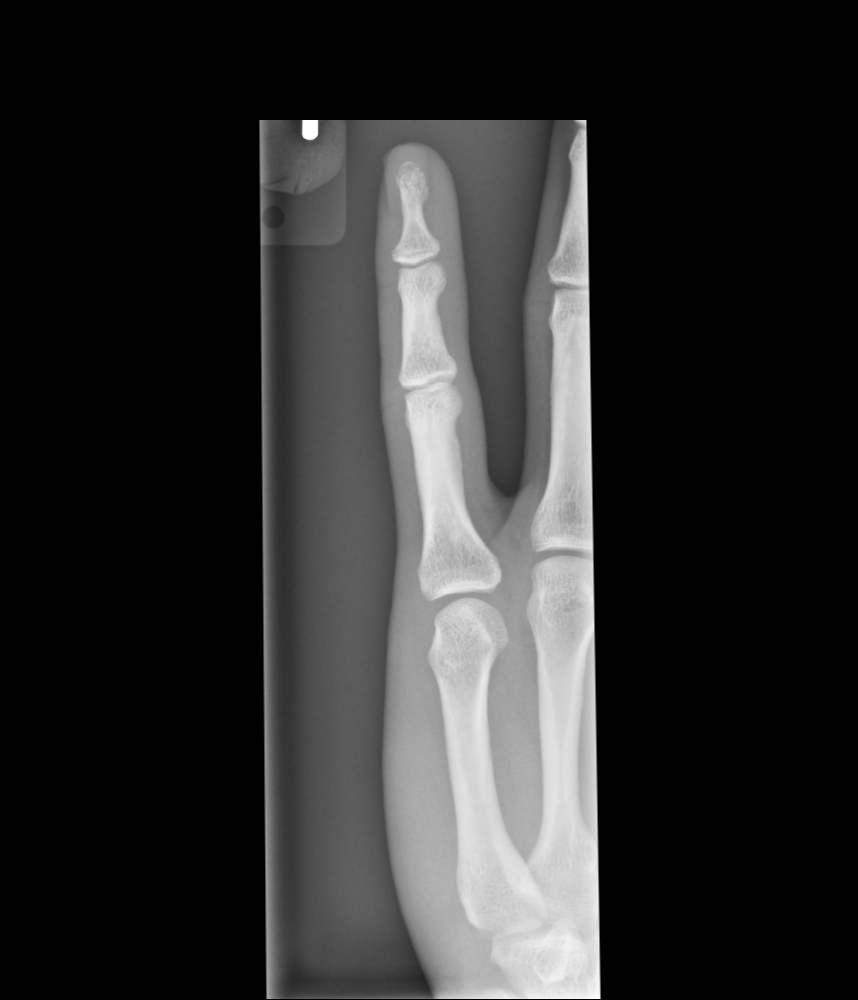
[im 2/4]
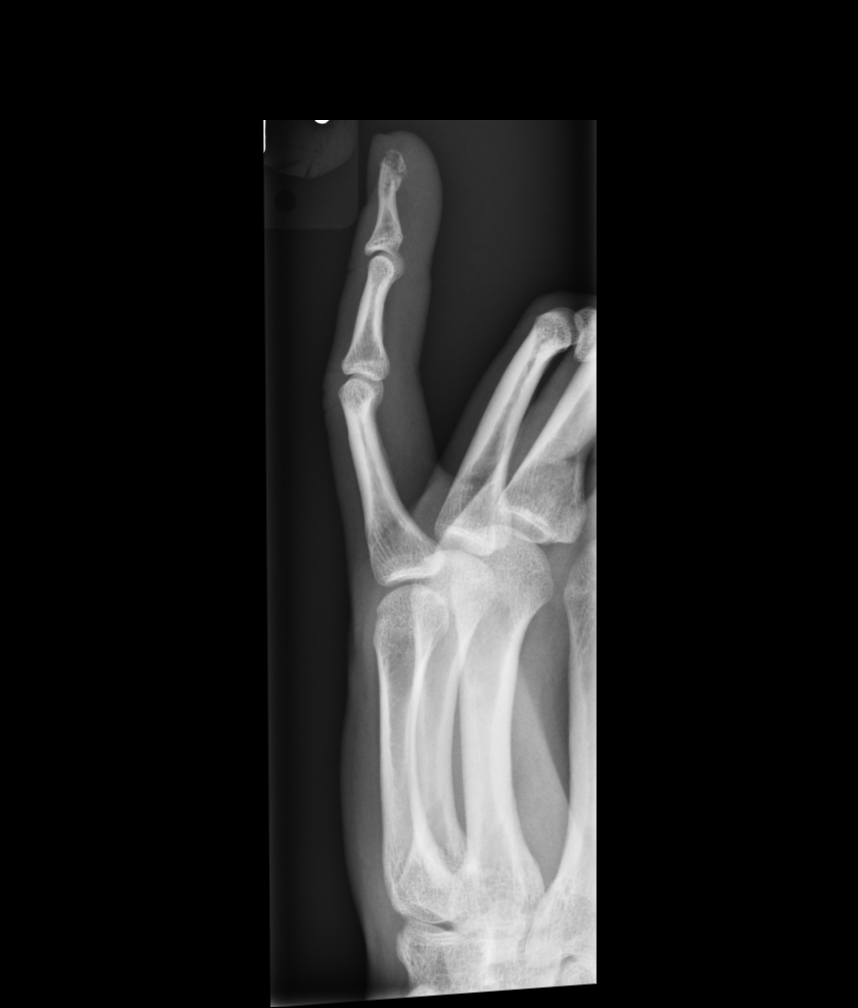
[im 3/4]
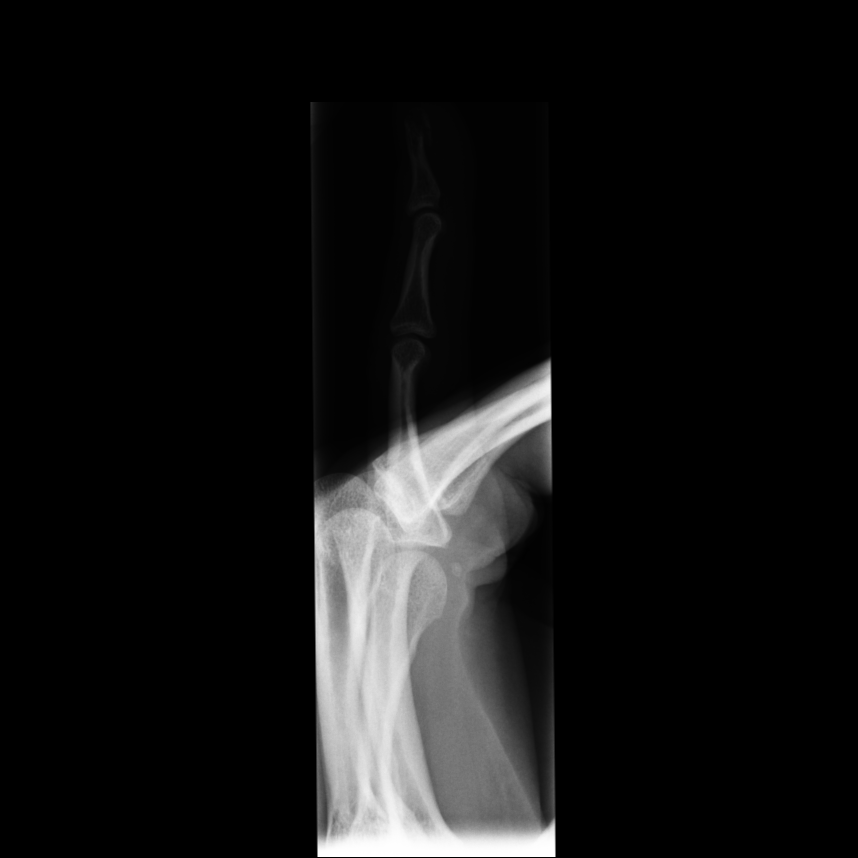
[im 4/4]
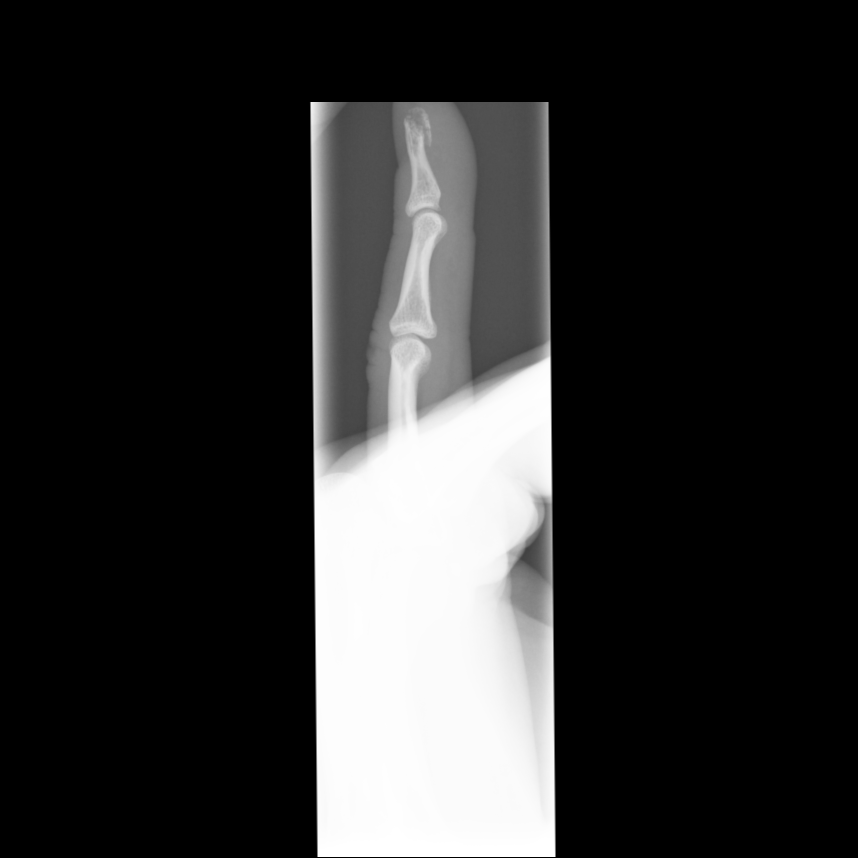

[4 of 4 positions shown; findings below may reference images not displayed]

FINDINGS: There is a mildly displaced distal phalangeal tuft fracture. The
remainder the finger is within normal limits. Mild soft tissue
changes are noted.
IMPRESSION: Distal phalangeal tuft fracture

## 2015-05-01 ENCOUNTER — Emergency Department
Admission: EM | Admit: 2015-05-01 | Discharge: 2015-05-02 | Disposition: A | Discharge status: Home / Self Care | Payer: Self-pay | Attending: Emergency Medicine | Admitting: Emergency Medicine

## 2015-05-01 ENCOUNTER — Emergency Department: Payer: Self-pay

## 2015-05-01 ENCOUNTER — Encounter: Payer: Self-pay | Admitting: *Deleted

## 2015-05-01 DIAGNOSIS — F17210 Nicotine dependence, cigarettes, uncomplicated: Secondary | ICD-10-CM | POA: Insufficient documentation

## 2015-05-01 DIAGNOSIS — Y9289 Other specified places as the place of occurrence of the external cause: Secondary | ICD-10-CM | POA: Insufficient documentation

## 2015-05-01 DIAGNOSIS — M62838 Other muscle spasm: Secondary | ICD-10-CM | POA: Insufficient documentation

## 2015-05-01 DIAGNOSIS — W208XXA Other cause of strike by thrown, projected or falling object, initial encounter: Secondary | ICD-10-CM | POA: Insufficient documentation

## 2015-05-01 DIAGNOSIS — Y998 Other external cause status: Secondary | ICD-10-CM | POA: Insufficient documentation

## 2015-05-01 DIAGNOSIS — M5412 Radiculopathy, cervical region: Secondary | ICD-10-CM | POA: Insufficient documentation

## 2015-05-01 DIAGNOSIS — Y9389 Activity, other specified: Secondary | ICD-10-CM | POA: Insufficient documentation

## 2015-05-01 HISTORY — DX: Pre-excitation syndrome: I45.6

## 2015-05-01 NOTE — ED Notes (Signed)
Pt c/o on-going neck pain after having part of a brick wall that struck him in the back of his neck and head x 2 weeks ago. Pt has taken both OTC meds w/o relief. Pt states he took T#3 w/ codeine w/o relief.

## 2015-05-01 NOTE — ED Provider Notes (Signed)
[facility] Emergency Department Provider Note ?  ? ____________________________________________ ? Time seen: 12:11 AM ? I have reviewed the triage vital signs and the nursing notes.  ________ HISTORY ? Chief Complaint Neck Pain     HPI  Michael [name] is a 25 y.o. male   presents emergency department complaining of posterior neck pain and radicular symptoms down left arm. Patient states that he was at work when part of a brick wall fell on the back of his neck and head. He states that this occurred approximately 2 weeks ago. He denies losing consciousness at that time. He states that he has had an intermittent headache and neck pain with mild radicular symptoms down the left arm for the last 2 weeks. Patient states that he is taken Tylenol and ibuprofen as well as Tylenol with codeine with minimal relief. Use of these medications is intermittent. He states the pain is intermittent, waxes and wanes in intensity but most the time and small to moderate, and not fully relieved by medications. ? ? ? Past Medical History  Diagnosis Date  . Wolff-Parkinson-White syndrome     There are no active problems to display for this patient.  ? Past Surgical History  Procedure Laterality Date  . Elbow fracture surgery Left   . Tonsillectomy     ? Current Outpatient Rx  Name  Route  Sig  Dispense  Refill  . cyclobenzaprine (FLEXERIL) 10 MG tablet   Oral   Take 1 tablet (10 mg total) by mouth 3 (three) times daily as needed for muscle spasms.   15 tablet   0   . meloxicam (MOBIC) 15 MG tablet   Oral   Take 1 tablet (15 mg total) by mouth daily.   30 tablet   0    ? Allergies Risperidone and related; Haldol; and Lamictal ? History reviewed. No pertinent family history. ? Social History Social History  Substance Use Topics  . Smoking status: Current Every Day Smoker    Types: Cigarettes  . Smokeless tobacco: Current User  . Alcohol Use: Yes   Comment: occasionally   ? Review of Systems Constitutional: no fever. Eyes: no discharge ENT: no sore throat. Cardiovascular: no chest pain. Respiratory: no cough. No sob Gastrointestinal: denies abdominal pain, vomiting, diarrhea, and constipation Genitourinary: no dysuria. Negative for hematuria Musculoskeletal: Negative for back pain. Endorses posterior neck pain. Skin: Negative for rash. Neurological: Negative for headaches  10-point ROS otherwise negative.  _______________ PHYSICAL EXAM: ? VITAL SIGNS:   ED Triage Vitals  Enc Vitals Group     BP 05/01/15 2138 130/76 mmHg     Pulse Rate 05/01/15 2138 67     Resp 05/01/15 2138 16     Temp 05/01/15 2138 97.9 F (36.6 C)     Temp Source 05/01/15 2138 Oral     SpO2 05/01/15 2138 98 %     Weight 05/01/15 2138 190 lb (86.183 kg)     Height 05/01/15 2138 6' (1.829 m)     Head Cir --      Peak Flow --      Pain Score 05/01/15 2138 8     Pain Loc --      Pain Edu? --      Excl. in GC? --    ?  Constitutional: Alert and oriented. Well appearing and in no distress. Eyes: Conjunctivae are normal.  ENT      Head: Normocephalic and atraumatic.      Ears:  Nose: No congestion/rhinnorhea.      Mouth/Throat: Mucous membranes are moist.   Hematological/Lymphatic/Immunilogical: No cervical lymphadenopathy. Cardiovascular: Normal rate, regular rhythm. Normal S1 and S2. Respiratory: Normal respiratory effort without tachypnea nor retractions. Lungs CTAB. Gastrointestinal: Soft and nontender. No distention. There is no CVA tenderness. Genitourinary:  Musculoskeletal: Nontender with normal range of motion in all extremities. No visible deformity to cervical spine. No tenderness to palpation over spinal processes. Mild tenderness to palpation bilateral paraspinal muscle groups. Mild spasms are palpated. Sensation is intact bilateral upper extremities and is equal. Radial pulses are palpated bilaterally.  Neurologic:  Normal  speech and language. No gross focal neurologic deficits are appreciated. Cranial nerves II through XII are grossly intact. Sensation is intact and equal bilateral upper extremities. Skin:  Skin is warm, dry and intact. No rash noted. Psychiatric: Mood and affect are normal. Speech and behavior are normal. Patient exhibits appropriate insight and judgment.    LABS (all labs ordered are listed, but only abnormal results are displayed)  Labs Reviewed - No data to display  ___________ RADIOLOGY  Cervical spine x-ray Impression: No acute osseous abnormality   I personally reviewed the images.  _____________ PROCEDURES ? Procedure(s) performed:    Medications  meloxicam (MOBIC) tablet 7.5 mg (7.5 mg Oral Given 05/02/15 0006)    ______________________________________________________ INITIAL IMPRESSION / ASSESSMENT AND PLAN / ED COURSE ? Pertinent labs & imaging results that were available during my care of the patient were reviewed by me and considered in my medical decision making (see chart for details).    Patient's diagnosis is consistent with cervical radiculopathy and paraspinal muscle spasms. Patient only placed on anti-inflammatories and muscle relaxers for symptom control. Patient is advised to give these medications at least 2 weeks of usage before calling them a treatment failure and following up with orthopedics. If patient still has symptoms he is to follow-up with orthopedics for further evaluation and treatment. Patient verbalizes understanding of diagnosis and treatment plan and verbalizes compliance with same.    Discharge Medication List as of 05/02/2015 12:05 AM    START taking these medications   Details  cyclobenzaprine (FLEXERIL) 10 MG tablet Take 1 tablet (10 mg total) by mouth 3 (three) times daily as needed for muscle spasms., Starting 05/02/2015, Until Discontinued, Print    meloxicam (MOBIC) 15 MG tablet Take 1 tablet (15 mg total) by mouth daily., Starting  05/02/2015, Until Discontinued, Print       ____________________________________________ FINAL CLINICAL IMPRESSION(S) / ED DIAGNOSES?  Final diagnoses:  Cervical radiculopathy  Cervical paraspinal muscle spasm            Racheal Patches, PA-C 05/02/15 0011  Jennye Moccasin, MD 05/26/15 [phone]

## 2015-05-02 MED ORDER — CYCLOBENZAPRINE HCL 10 MG PO TABS: 10.0000 mg | ORAL_TABLET | Freq: Three times a day (TID) | ORAL | Status: DC | PRN

## 2015-05-02 MED ORDER — MELOXICAM 15 MG PO TABS: 15.0000 mg | ORAL_TABLET | Freq: Every day | ORAL | Status: DC

## 2015-05-02 MED ORDER — MELOXICAM 7.5 MG PO TABS
7.5000 mg | ORAL_TABLET | ORAL | Status: AC
  Administered 2015-05-02: 7.5 mg via ORAL
  Filled 2015-05-02: qty 1

## 2015-05-02 NOTE — Discharge Instructions (Signed)

## 2015-08-13 ENCOUNTER — Emergency Department
Admission: EM | Admit: 2015-08-13 | Discharge: 2015-08-13 | Disposition: A | Discharge status: Home / Self Care | Payer: Medicaid Other | Attending: Emergency Medicine | Admitting: Emergency Medicine

## 2015-08-13 DIAGNOSIS — Y939 Activity, unspecified: Secondary | ICD-10-CM | POA: Diagnosis not present

## 2015-08-13 DIAGNOSIS — Y999 Unspecified external cause status: Secondary | ICD-10-CM | POA: Insufficient documentation

## 2015-08-13 DIAGNOSIS — S80862A Insect bite (nonvenomous), left lower leg, initial encounter: Secondary | ICD-10-CM | POA: Diagnosis present

## 2015-08-13 DIAGNOSIS — W57XXXA Bitten or stung by nonvenomous insect and other nonvenomous arthropods, initial encounter: Secondary | ICD-10-CM | POA: Diagnosis not present

## 2015-08-13 DIAGNOSIS — Y929 Unspecified place or not applicable: Secondary | ICD-10-CM | POA: Insufficient documentation

## 2015-08-13 DIAGNOSIS — F17210 Nicotine dependence, cigarettes, uncomplicated: Secondary | ICD-10-CM | POA: Insufficient documentation

## 2015-08-13 DIAGNOSIS — L089 Local infection of the skin and subcutaneous tissue, unspecified: Secondary | ICD-10-CM

## 2015-08-13 HISTORY — DX: Transient cerebral ischemic attack, unspecified: G45.9

## 2015-08-13 MED ORDER — DOXYCYCLINE HYCLATE 100 MG PO TABS: 100.0000 mg | ORAL_TABLET | Freq: Two times a day (BID) | ORAL | Status: DC

## 2015-08-13 NOTE — ED Notes (Signed)
Patient presents with complaints of a tick bite to the lower left ankle that happened 3 days ago. Has a nickel sized circular inflammation on the left posterior ankle. Noticed after he had been at work all day and not it itches and complains of nausea now.  Denies vomiting and diarrhea.

## 2015-08-13 NOTE — ED Provider Notes (Signed)
[facility] Emergency Department Provider Note  ____________________________________________  Time seen: Approximately 9:13 PM  I have reviewed the triage vital signs and the nursing notes.   HISTORY  Chief Complaint Insect Bite    HPI Michael [name] is a 25 y.o. male who presents emergency department complaining of a tick bite to left lower ankle. Patient states that the tick had been on unknown and unknown length of time. He states that the entire tick was removed. He states that he had some localized irritation that is now turning painful. He states that it is erythematous, edematous, tender to touch. Patient does report some nausea but is unsure whether this is related or not. He denies any headache, neck pain, usually chills, chest pain, shortness of breath, abdominal pain, emesis, diarrhea or constipation.   Past Medical History  Diagnosis Date  . Wolff-Parkinson-White syndrome   . TIA (transient ischemic attack)     4 years ago    There are no active problems to display for this patient.   Past Surgical History  Procedure Laterality Date  . Elbow fracture surgery Left   . Tonsillectomy    . Cardiac catheterization Bilateral     2 years ago     Current Outpatient Rx  Name  Route  Sig  Dispense  Refill  . cyclobenzaprine (FLEXERIL) 10 MG tablet   Oral   Take 1 tablet (10 mg total) by mouth 3 (three) times daily as needed for muscle spasms.   15 tablet   0   . doxycycline (VIBRA-TABS) 100 MG tablet   Oral   Take 1 tablet (100 mg total) by mouth 2 (two) times daily.   14 tablet   0   . meloxicam (MOBIC) 15 MG tablet   Oral   Take 1 tablet (15 mg total) by mouth daily.   30 tablet   0     Allergies Risperidone and related; Haldol; and Lamictal  History reviewed. No pertinent family history.  Social History Social History  Substance Use Topics  . Smoking status: Current Every Day Smoker    Types: Cigarettes  . Smokeless  tobacco: Current User  . Alcohol Use: Yes     Comment: occasionally     Review of Systems  Constitutional: No fever/chills Eyes: No visual changes.  Cardiovascular: no chest pain. Respiratory: no cough. No SOB. Gastrointestinal: No abdominal pain.  Positive for nausea but no vomiting.  No diarrhea.  No constipation. Musculoskeletal: Negative for myalgias and arthralgias Skin: Negative for rash. Positive for lesion surrounding tick bite. Neurological: Negative for headaches, focal weakness or numbness. 10-point ROS otherwise negative.  ____________________________________________   PHYSICAL EXAM:  VITAL SIGNS: ED Triage Vitals  Enc Vitals Group     BP 08/13/15 2012 120/76 mmHg     Pulse Rate 08/13/15 2012 68     Resp 08/13/15 2012 18     Temp 08/13/15 2012 97.8 F (36.6 C)     Temp Source 08/13/15 2012 Oral     SpO2 08/13/15 2012 99 %     Weight 08/13/15 2012 190 lb (86.183 kg)     Height 08/13/15 2012 6' (1.829 m)     Head Cir --      Peak Flow --      Pain Score 08/13/15 2013 4     Pain Loc --      Pain Edu? --      Excl. in GC? --      Constitutional: Alert  and oriented. Well appearing and in no acute distress. Eyes: Conjunctivae are normal. PERRL. EOMI. Head: Atraumatic. Cardiovascular: Normal rate, regular rhythm. Normal S1 and S2.  Good peripheral circulation. Respiratory: Normal respiratory effort without tachypnea or retractions. Lungs CTAB. Gastrointestinal: Soft and nontender.No guarding or rigidity. No distention.  Musculoskeletal: No lower extremity tenderness nor edema.  No joint effusions. Full range of motion bilateral lower extremities. Neurologic:  Normal speech and language. No gross focal neurologic deficits are appreciated.  Skin:  Skin is warm, dry and intact. Edematous and erythematous lesion noted to the left lateral ankle. This is surrounding a central excoriation. Area is tender to palpation. No fluctuance noted. Lesion does not have a  targetoid or central clearing appearance Psychiatric: Mood and affect are normal. Speech and behavior are normal. Patient exhibits appropriate insight and judgement.   ____________________________________________   LABS (all labs ordered are listed, but only abnormal results are displayed)  Labs Reviewed - No data to display ____________________________________________  EKG   ____________________________________________  RADIOLOGY   No results found.  ____________________________________________    PROCEDURES  Procedure(s) performed:       Medications - No data to display   ____________________________________________   INITIAL IMPRESSION / ASSESSMENT AND PLAN / ED COURSE  Pertinent labs & imaging results that were available during my care of the patient were reviewed by me and considered in my medical decision making (see chart for details).  Patient's diagnosis is consistent with infected tick bite. At this time there is little concern for tick borne illness. Symptoms have been ongoing less than 72 hours and ordering IgM titers for [facility] fever or Lyme's disease would be inconclusive at this time. Patient will be treated with doxycycline to cover for cellulitis and any tickborne illness.. Patient is to follow up with primary care provider if symptoms persist past this treatment course. Patient is given ED precautions to return to the ED for any worsening or new symptoms.     ____________________________________________  FINAL CLINICAL IMPRESSION(S) / ED DIAGNOSES  Final diagnoses:  Infected tick bite of lower leg, left, initial encounter      NEW MEDICATIONS STARTED DURING THIS VISIT:  New Prescriptions   DOXYCYCLINE (VIBRA-TABS) 100 MG TABLET    Take 1 tablet (100 mg total) by mouth 2 (two) times daily.        This chart was dictated using voice recognition software/Dragon. Despite best efforts to proofread, errors can occur  which can change the meaning. Any change was purely unintentional.    Racheal PatchesJonathan D Ledger Heindl, PA-C 08/13/15 2153  Sharman CheekPhillip Stafford, MD 08/15/15 [phone]

## 2015-08-13 NOTE — Discharge Instructions (Signed)
Tick Bite Information Ticks are insects that attach themselves to the skin and draw blood for food. There are various types of ticks. Common types include wood ticks and deer ticks. Most ticks live in shrubs and grassy areas. Ticks can climb onto your body when you make contact with leaves or grass where the tick is waiting. The most common places on the body for ticks to attach themselves are the scalp, neck, armpits, waist, and groin. Most tick bites are harmless, but sometimes ticks carry germs that cause diseases. These germs can be spread to a person during the tick's feeding process. The chance of a disease spreading through a tick bite depends on:   The type of tick.  Time of year.   How long the tick is attached.   Geographic location.  HOW CAN YOU PREVENT TICK BITES? Take these steps to help prevent tick bites when you are outdoors:  Wear protective clothing. Long sleeves and long pants are best.   Wear white clothes so you can see ticks more easily.  Tuck your pant legs into your socks.   If walking on a trail, stay in the middle of the trail to avoid brushing against bushes.  Avoid walking through areas with long grass.  Put insect repellent on all exposed skin and along boot tops, pant legs, and sleeve cuffs.   Check clothing, hair, and skin repeatedly and before going inside.   Brush off any ticks that are not attached.  Take a shower or bath as soon as possible after being outdoors.  WHAT IS THE PROPER WAY TO REMOVE A TICK? Ticks should be removed as soon as possible to help prevent diseases caused by tick bites. 1. If latex gloves are available, put them on before trying to remove a tick.  2. Using fine-point tweezers, grasp the tick as close to the skin as possible. You may also use curved forceps or a tick removal tool. Grasp the tick as close to its head as possible. Avoid grasping the tick on its body. 3. Pull gently with steady upward pressure until  the tick lets go. Do not twist the tick or jerk it suddenly. This may break off the tick's head or mouth parts. 4. Do not squeeze or crush the tick's body. This could force disease-carrying fluids from the tick into your body.  5. After the tick is removed, wash the bite area and your hands with soap and water or other disinfectant such as alcohol. 6. Apply a small amount of antiseptic cream or ointment to the bite site.  7. Wash and disinfect any instruments that were used.  Do not try to remove a tick by applying a hot match, petroleum jelly, or fingernail polish to the tick. These methods do not work and may increase the chances of disease being spread from the tick bite.  WHEN SHOULD YOU SEEK MEDICAL CARE? Contact your health care provider if you are unable to remove a tick from your skin or if a part of the tick breaks off and is stuck in the skin.  After a tick bite, you need to be aware of signs and symptoms that could be related to diseases spread by ticks. Contact your health care provider if you develop any of the following in the days or weeks after the tick bite:  Unexplained fever.  Rash. A circular rash that appears days or weeks after the tick bite may indicate the possibility of Lyme disease. The rash may resemble   a target with a bull's-eye and may occur at a different part of your body than the tick bite.  Redness and swelling in the area of the tick bite.   Tender, swollen lymph glands.   Diarrhea.   Weight loss.   Cough.   Fatigue.   Muscle, joint, or bone pain.   Abdominal pain.   Headache.   Lethargy or a change in your level of consciousness.  Difficulty walking or moving your legs.   Numbness in the legs.   Paralysis.  Shortness of breath.   Confusion.   Repeated vomiting.    This information is not intended to replace advice given to you by your health care provider. Make sure you discuss any questions you have with your health  care provider.   Document Released: 04/06/2000 Document Revised: 04/30/2014 Document Reviewed: 09/17/2012 Elsevier Interactive Patient Education 2016 [employer]  

## 2016-05-21 ENCOUNTER — Emergency Department
Admission: EM | Admit: 2016-05-21 | Discharge: 2016-05-21 | Disposition: A | Discharge status: Home / Self Care | Payer: Medicaid Other | Attending: Emergency Medicine | Admitting: Emergency Medicine

## 2016-05-21 ENCOUNTER — Emergency Department: Payer: Medicaid Other

## 2016-05-21 ENCOUNTER — Encounter: Payer: Self-pay | Admitting: Emergency Medicine

## 2016-05-21 DIAGNOSIS — Y939 Activity, unspecified: Secondary | ICD-10-CM | POA: Diagnosis not present

## 2016-05-21 DIAGNOSIS — N5082 Scrotal pain: Secondary | ICD-10-CM | POA: Diagnosis not present

## 2016-05-21 DIAGNOSIS — Y999 Unspecified external cause status: Secondary | ICD-10-CM | POA: Diagnosis not present

## 2016-05-21 DIAGNOSIS — W19XXXA Unspecified fall, initial encounter: Secondary | ICD-10-CM | POA: Diagnosis not present

## 2016-05-21 DIAGNOSIS — Y929 Unspecified place or not applicable: Secondary | ICD-10-CM | POA: Diagnosis not present

## 2016-05-21 DIAGNOSIS — S3992XA Unspecified injury of lower back, initial encounter: Secondary | ICD-10-CM | POA: Diagnosis present

## 2016-05-21 DIAGNOSIS — F17210 Nicotine dependence, cigarettes, uncomplicated: Secondary | ICD-10-CM | POA: Diagnosis not present

## 2016-05-21 DIAGNOSIS — M545 Low back pain: Secondary | ICD-10-CM | POA: Insufficient documentation

## 2016-05-21 LAB — URINALYSIS, COMPLETE (UACMP) WITH MICROSCOPIC
BACTERIA UA: NONE SEEN
BILIRUBIN URINE: NEGATIVE
Glucose, UA: NEGATIVE mg/dL
HGB URINE DIPSTICK: NEGATIVE
Ketones, ur: NEGATIVE mg/dL
Leukocytes, UA: NEGATIVE
NITRITE: NEGATIVE
Protein, ur: NEGATIVE mg/dL
Specific Gravity, Urine: 1.001 — ABNORMAL LOW (ref 1.005–1.030)
Squamous Epithelial / LPF: NONE SEEN
WBC, UA: NONE SEEN WBC/hpf (ref 0–5)
pH: 7 (ref 5.0–8.0)

## 2016-05-21 MED ORDER — TRAMADOL HCL 50 MG PO TABS: 50.0000 mg | ORAL_TABLET | Freq: Two times a day (BID) | ORAL | 0 refills | Status: DC | PRN

## 2016-05-21 MED ORDER — IBUPROFEN 600 MG PO TABS: 600.0000 mg | ORAL_TABLET | Freq: Three times a day (TID) | ORAL | 0 refills | Status: DC | PRN

## 2016-05-21 MED ORDER — TRAMADOL HCL 50 MG PO TABS
50.0000 mg | ORAL_TABLET | Freq: Once | ORAL | Status: AC
  Administered 2016-05-21: 50 mg via ORAL
  Filled 2016-05-21: qty 1

## 2016-05-21 MED ORDER — METHOCARBAMOL 750 MG PO TABS: 750.0000 mg | ORAL_TABLET | Freq: Four times a day (QID) | ORAL | 0 refills | Status: DC

## 2016-05-21 MED ORDER — CYCLOBENZAPRINE HCL 10 MG PO TABS
10.0000 mg | ORAL_TABLET | Freq: Once | ORAL | Status: AC
  Administered 2016-05-21: 10 mg via ORAL
  Filled 2016-05-21: qty 1

## 2016-05-21 MED ORDER — IBUPROFEN 600 MG PO TABS
600.0000 mg | ORAL_TABLET | Freq: Once | ORAL | Status: AC
  Administered 2016-05-21: 600 mg via ORAL
  Filled 2016-05-21: qty 1

## 2016-05-21 NOTE — Discharge Instructions (Signed)
Objective findings failure scrotum pain advised to follow-up with urologist for further evaluation.

## 2016-05-21 NOTE — ED Triage Notes (Signed)
States has pain L testicle and swelling x 3 weeks. Denies dysuria. States pain radiates down L leg. States did have fall 4 weeks ago and had back pain after.

## 2016-05-21 NOTE — ED Provider Notes (Signed)
[facility] Emergency Department Provider Note   ____________________________________________   First MD Initiated Contact with Patient 05/21/16 1404     (approximate)  I have reviewed the triage vital signs and the nursing notes.   HISTORY  Chief Complaint Groin Pain    HPI Michael [name] is a 26 y.o. male patient complaining left testicle swelling and pain for 3 weeks. Patient states status post a fall that happened 4 weeks ago. Patient denied dysuria or hematuria. Patient states pain is radiating down the left leg. Patient rates the pain as 8/10. Patient scattered pain as "achy". Patient stated no relief over-the-counter anti-inflammatory medications. Patient denies pain with erection. Patient also complaining of back pain secondary to a fall which occurred 4 weeks ago. Patient denies any radicular component to his back pain. Patient denies any bladder function. Past Medical History:  Diagnosis Date  . TIA (transient ischemic attack)    4 years ago  . Wolff-Parkinson-White syndrome     There are no active problems to display for this patient.   Past Surgical History:  Procedure Laterality Date  . ABLATION     for wolff-parkinson white  . CARDIAC CATHETERIZATION Bilateral    2 years ago   . ELBOW FRACTURE SURGERY Left   . TONSILLECTOMY      Prior to Admission medications   Medication Sig Start Date End Date Taking? Authorizing Provider  cyclobenzaprine (FLEXERIL) 10 MG tablet Take 1 tablet (10 mg total) by mouth 3 (three) times daily as needed for muscle spasms. 05/02/15   Delorise RoyalsJonathan D Cuthriell, PA-C  doxycycline (VIBRA-TABS) 100 MG tablet Take 1 tablet (100 mg total) by mouth 2 (two) times daily. 08/13/15   Delorise RoyalsJonathan D Cuthriell, PA-C  ibuprofen (ADVIL,MOTRIN) 600 MG tablet Take 1 tablet (600 mg total) by mouth every 8 (eight) hours as needed. 05/21/16   Joni Reiningonald K Bolz, PA-C  meloxicam (MOBIC) 15 MG tablet Take 1 tablet (15 mg total) by mouth daily.  05/02/15   Delorise RoyalsJonathan D Cuthriell, PA-C  methocarbamol (ROBAXIN-750) 750 MG tablet Take 1 tablet (750 mg total) by mouth 4 (four) times daily. 05/21/16   Joni Reiningonald K Gammel, PA-C  traMADol (ULTRAM) 50 MG tablet Take 1 tablet (50 mg total) by mouth every 12 (twelve) hours as needed. 05/21/16   Joni Reiningonald K Maclin, PA-C    Allergies Risperidone and related; Haldol [haloperidol]; and Lamictal [lamotrigine]  No family history on file.  Social History Social History  Substance Use Topics  . Smoking status: Current Every Day Smoker    Packs/day: 2.00    Types: Cigarettes  . Smokeless tobacco: Current User  . Alcohol use Yes     Comment: occasionally    Review of Systems Constitutional: No fever/chills Eyes: No visual changes. ENT: No sore throat. Cardiovascular: Denies chest pain. Respiratory: Denies shortness of breath. Gastrointestinal: No abdominal pain.  No nausea, no vomiting.  No diarrhea.  No constipation. Genitourinary: Negative for dysuria. Left scrotal pain and edema. Musculoskeletal: Positive for back pain. Skin: Negative for rash. Neurological: Negative for headaches, focal weakness or numbness. Allergic/Immunilogical: See medication list  ____________________________________________   PHYSICAL EXAM:  VITAL SIGNS: ED Triage Vitals  Enc Vitals Group     BP 05/21/16 1342 120/83     Pulse Rate 05/21/16 1342 90     Resp 05/21/16 1342 18     Temp 05/21/16 1342 98.5 F (36.9 C)     Temp Source 05/21/16 1342 Oral     SpO2 05/21/16 1342  98 %     Weight 05/21/16 1344 194 lb (88 kg)     Height 05/21/16 1344 6' (1.829 m)     Head Circumference --      Peak Flow --      Pain Score 05/21/16 1344 8     Pain Loc --      Pain Edu? --      Excl. in GC? --     Constitutional: Alert and oriented. Well appearing and in no acute distress. Eyes: Conjunctivae are normal. PERRL. EOMI. Head: Atraumatic. Nose: No congestion/rhinnorhea. Mouth/Throat: Mucous membranes are moist.  Oropharynx  non-erythematous. Neck: No stridor.  No cervical spine tenderness to palpation. Hematological/Lymphatic/Immunilogical: No cervical lymphadenopathy. Cardiovascular: Normal rate, regular rhythm. Grossly normal heart sounds.  Good peripheral circulation. Respiratory: Normal respiratory effort.  No retractions. Lungs CTAB. Gastrointestinal: Soft and nontender. No distention. No abdominal bruits. No CVA tenderness. Genitourinary: No obvious scrotum edema. Patient has moderate guarding palpation of the left testicle. Musculoskeletal:Patient sits to stand. Lives on upper extremity. Patient back stays in a slightly flexed position. No obvious deformity. Patient has moderate guarding palpation L4-S1. Patient had decreased range of motion with lateral movement secondary complaint of pain with bilateral paraspinal muscle spasms. Patient Straight-leg test.  Neurologic:  Normal speech and language. No gross focal neurologic deficits are appreciated. No gait instability. Skin:  Skin is warm, dry and intact. No rash noted. Psychiatric: Mood and affect are normal. Speech and behavior are normal.  ____________________________________________   LABS (all labs ordered are listed, but only abnormal results are displayed)  Labs Reviewed  URINALYSIS, COMPLETE (UACMP) WITH MICROSCOPIC - Abnormal; Notable for the following:       Result Value   Color, Urine COLORLESS (*)    APPearance CLEAR (*)    Specific Gravity, Urine 1.001 (*)    All other components within normal limits   ____________________________________________  EKG   ____________________________________________  RADIOLOGY  Ultrasound and Doppler imaging reveals no acute findings. Patient had bilateral small hydroceles. ____________________________________________   PROCEDURES  Procedure(s) performed: None  Procedures  Critical Care performed: No  ____________________________________________   INITIAL IMPRESSION / ASSESSMENT AND  PLAN / ED COURSE  Pertinent labs & imaging results that were available during my care of the patient were reviewed by me and considered in my medical decision making (see chart for details).  Left scrotal pain and low back pain. Patient given discharge Instructions. Patient advised to follow-up with urology protocol for an appointment in the morning. Patient also advised to establish care with family clinic to monitor and treat his chronic back pain. Patient given a prescription for tramadol, Robaxinl, and ibuprofen.      ____________________________________________   FINAL CLINICAL IMPRESSION(S) / ED DIAGNOSES  Final diagnoses:  Pain in scrotum  Scrotum pain      NEW MEDICATIONS STARTED DURING THIS VISIT:  New Prescriptions   IBUPROFEN (ADVIL,MOTRIN) 600 MG TABLET    Take 1 tablet (600 mg total) by mouth every 8 (eight) hours as needed.   METHOCARBAMOL (ROBAXIN-750) 750 MG TABLET    Take 1 tablet (750 mg total) by mouth 4 (four) times daily.   TRAMADOL (ULTRAM) 50 MG TABLET    Take 1 tablet (50 mg total) by mouth every 12 (twelve) hours as needed.     Note:  This document was prepared using Dragon voice recognition software and may include unintentional dictation errors.    HUSTON STONEHOCKER, PA-C 05/21/16 1550    Minna Antis, MD  05/24/16 1501  

## 2016-05-21 NOTE — ED Notes (Signed)
See triage note  Having swelling to left testicle area for the past 3 weeks   No urinary sx's   Ambulates well

## 2017-02-22 ENCOUNTER — Encounter: Payer: Self-pay | Admitting: Emergency Medicine

## 2017-02-22 ENCOUNTER — Emergency Department: Payer: Medicaid Other

## 2017-02-22 DIAGNOSIS — R0602 Shortness of breath: Secondary | ICD-10-CM | POA: Diagnosis present

## 2017-02-22 DIAGNOSIS — Z5321 Procedure and treatment not carried out due to patient leaving prior to being seen by health care provider: Secondary | ICD-10-CM | POA: Insufficient documentation

## 2017-02-22 LAB — BASIC METABOLIC PANEL
Anion gap: 7 (ref 5–15)
BUN: 11 mg/dL (ref 6–20)
CO2: 26 mmol/L (ref 22–32)
Calcium: 9 mg/dL (ref 8.9–10.3)
Chloride: 105 mmol/L (ref 101–111)
Creatinine, Ser: 0.95 mg/dL (ref 0.61–1.24)
Glucose, Bld: 104 mg/dL — ABNORMAL HIGH (ref 65–99)
POTASSIUM: 3.6 mmol/L (ref 3.5–5.1)
SODIUM: 138 mmol/L (ref 135–145)

## 2017-02-22 LAB — CBC
HCT: 43.7 % (ref 40.0–52.0)
Hemoglobin: 15 g/dL (ref 13.0–18.0)
MCH: 31 pg (ref 26.0–34.0)
MCHC: 34.3 g/dL (ref 32.0–36.0)
MCV: 90.6 fL (ref 80.0–100.0)
PLATELETS: 275 10*3/uL (ref 150–440)
RBC: 4.83 MIL/uL (ref 4.40–5.90)
RDW: 13.9 % (ref 11.5–14.5)
WBC: 9.1 10*3/uL (ref 3.8–10.6)

## 2017-02-22 LAB — TROPONIN I

## 2017-02-22 MED ORDER — ALBUTEROL SULFATE (2.5 MG/3ML) 0.083% IN NEBU
5.0000 mg | INHALATION_SOLUTION | Freq: Once | RESPIRATORY_TRACT | Status: AC
  Administered 2017-02-22: 5 mg via RESPIRATORY_TRACT
  Filled 2017-02-22: qty 6

## 2017-02-22 NOTE — ED Triage Notes (Signed)
Pt in via POV with complaints of shortness of breath, generalized body aches, fatigue, N/V x 2 days.  Pt afebrile upon arrival.  Pt appears drowsy in triage, denies any drug or alcohol use.  Vitals WDL, NAD noted at this time.

## 2017-02-23 ENCOUNTER — Emergency Department
Admission: EM | Admit: 2017-02-23 | Discharge: 2017-02-23 | Disposition: A | Discharge status: Home / Self Care | Payer: Medicaid Other | Attending: Emergency Medicine | Admitting: Emergency Medicine

## 2017-02-23 NOTE — ED Notes (Signed)
Called for waiting from x 3

## 2017-02-25 ENCOUNTER — Telehealth: Payer: Self-pay | Admitting: Emergency Medicine

## 2017-02-25 NOTE — Telephone Encounter (Signed)
Called patient due to lwot to inquire about condition and follow up plans. Pt says he is better than he was, but still feels pretty bad with cough and his chest hurts.  I explained that he could return or go to urgent care --and let them know we did the cxr and blood work already.  He does not have a pcp.

## 2017-03-21 ENCOUNTER — Emergency Department
Admission: EM | Admit: 2017-03-21 | Discharge: 2017-03-21 | Disposition: A | Discharge status: Home / Self Care | Payer: Medicaid Other | Attending: Emergency Medicine | Admitting: Emergency Medicine

## 2017-03-21 ENCOUNTER — Encounter: Payer: Self-pay | Admitting: *Deleted

## 2017-03-21 DIAGNOSIS — Y9389 Activity, other specified: Secondary | ICD-10-CM | POA: Insufficient documentation

## 2017-03-21 DIAGNOSIS — Y929 Unspecified place or not applicable: Secondary | ICD-10-CM | POA: Insufficient documentation

## 2017-03-21 DIAGNOSIS — S4991XA Unspecified injury of right shoulder and upper arm, initial encounter: Secondary | ICD-10-CM | POA: Diagnosis present

## 2017-03-21 DIAGNOSIS — S46911A Strain of unspecified muscle, fascia and tendon at shoulder and upper arm level, right arm, initial encounter: Secondary | ICD-10-CM | POA: Insufficient documentation

## 2017-03-21 DIAGNOSIS — F17210 Nicotine dependence, cigarettes, uncomplicated: Secondary | ICD-10-CM | POA: Diagnosis not present

## 2017-03-21 DIAGNOSIS — Y999 Unspecified external cause status: Secondary | ICD-10-CM | POA: Diagnosis not present

## 2017-03-21 DIAGNOSIS — X58XXXA Exposure to other specified factors, initial encounter: Secondary | ICD-10-CM | POA: Insufficient documentation

## 2017-03-21 MED ORDER — DEXAMETHASONE SODIUM PHOSPHATE 10 MG/ML IJ SOLN
10.0000 mg | Freq: Once | INTRAMUSCULAR | Status: AC
  Administered 2017-03-21: 10 mg via INTRAMUSCULAR
  Filled 2017-03-21: qty 1

## 2017-03-21 MED ORDER — PREDNISONE 10 MG PO TABS: ORAL_TABLET | ORAL | 0 refills | Status: DC

## 2017-03-21 MED ORDER — TRAMADOL HCL 50 MG PO TABS: 50.0000 mg | ORAL_TABLET | Freq: Four times a day (QID) | ORAL | 0 refills | Status: DC | PRN

## 2017-03-21 NOTE — ED Notes (Signed)
See triage note  Presents with pain to right side of neck and shoulder  States pain started about 3 days ago w/o known injury..  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

## 2017-03-21 NOTE — Discharge Instructions (Signed)
Call and make an appointment Dr. Binnie RailPoggi's  office for further evaluation of the right shoulder. Alternate between ice and heat to see which one helps you more.   Been taking cyclobenzaprine every 8 hours as directed. Begin taking prednisone starting at 60 mg and tapering down as directed on your bottle. Also tramadol as needed for severe pain. Do not drive or operate machinery while taking the cyclobenzaprine or tramadol.   Limited use of the right armfor the next 3 days.

## 2017-03-21 NOTE — ED Provider Notes (Signed)
[facility] Emergency Department Provider Note   ____________________________________________   First MD Initiated Contact with Patient 03/21/17 [phone]     (approximate)  I have reviewed the triage vital signs and the nursing notes.   HISTORY  Chief Complaint Shoulder Pain   HPI Michael [name] is a 26 y.o. male for complaint of right shoulder pain. Patient states that he was seen at an urgent care where x-rays were performed and reported as negative. Patient was started on Flexeril and given a prescription for diclofenac which she has not taken yet. He states his mother told him it would take several days for this medication to get into his system. He continues to have pain especially with range of motion or sudden movements such as cough. He denies any previous shoulder problems. He denies any recent injury.he rates pain as a 9/10.   Past Medical History:  Diagnosis Date  . TIA (transient ischemic attack)    4 years ago  . Wolff-Parkinson-White syndrome     There are no active problems to display for this patient.   Past Surgical History:  Procedure Laterality Date  . ABLATION     for wolff-parkinson white  . CARDIAC CATHETERIZATION Bilateral    2 years ago   . ELBOW FRACTURE SURGERY Left   . TONSILLECTOMY      Prior to Admission medications   Medication Sig Start Date End Date Taking? Authorizing Provider  cyclobenzaprine (FLEXERIL) 10 MG tablet Take 10 mg by mouth 3 (three) times daily as needed for muscle spasms.   Yes [provider]  predniSONE (DELTASONE) 10 MG tablet Take 6 tablets  today, on day 2 take 5 tablets, day 3 take 4 tablets, day 4 take 3 tablets, day 5 take  2 tablets and 1 tablet the last day 03/21/17   Tommi RumpsSummers, Matti Killingsworth L, PA-C  traMADol (ULTRAM) 50 MG tablet Take 1 tablet (50 mg total) by mouth every 6 (six) hours as needed. 03/21/17   Tommi RumpsSummers, Kerissa Coia L, PA-C    Allergies Risperidone and related; Haldol  [haloperidol]; and Lamictal [lamotrigine]  History reviewed. No pertinent family history.  Social History Social History   Tobacco Use  . Smoking status: Current Every Day Smoker    Packs/day: 2.00    Types: Cigarettes  . Smokeless tobacco: Current User  Substance Use Topics  . Alcohol use: Yes    Comment: occasionally  . Drug use: No    Review of Systems Constitutional: No fever/chills Cardiovascular: Denies chest pain. Respiratory: Denies shortness of breath. Musculoskeletal: positive right shoulder pain. Skin: Negative for rash. Neurological: Negative for headaches, focal weakness or numbness. ___________________________________________   PHYSICAL EXAM:  VITAL SIGNS: ED Triage Vitals  Enc Vitals Group     BP 03/21/17 0759 123/65     Pulse Rate 03/21/17 0759 75     Resp 03/21/17 0759 18     Temp 03/21/17 0759 98.2 F (36.8 C)     Temp Source 03/21/17 0759 Oral     SpO2 03/21/17 0759 98 %     Weight 03/21/17 0758 193 lb (87.5 kg)     Height 03/21/17 0758 6' (1.829 m)     Head Circumference --      Peak Flow --      Pain Score 03/21/17 0755 9     Pain Loc --      Pain Edu? --      Excl. in GC? --    Constitutional: Alert and oriented.  Well appearing and in no acute distress. Eyes: Conjunctivae are normal.  Head: Atraumatic. Neck: No stridor.  No tenderness on palpation of cervical spine posteriorly. Cardiovascular: Normal rate, regular rhythm. Grossly normal heart sounds.  Good peripheral circulation. Respiratory: Normal respiratory effort.  No retractions. Lungs CTAB. Musculoskeletal: on examination of the right shoulder there is no gross deformity. There is moderate tenderness on palpation the parascapular muscles on the right and right trapezius muscle. There is no tenderness on palpation of the A-C joint or upper arm. Range of motion is without crepitus. There is no point tenderness on palpation of soft tissues from the proximal humerus to the elbow. No  erythema or abrasions were seen. Neurologic:  Normal speech and language. No gross focal neurologic deficits are appreciated.  Skin:  Skin is warm, dry and intact.  Psychiatric: Mood and affect are normal. Speech and behavior are normal.  ____________________________________________   LABS (all labs ordered are listed, but only abnormal results are displayed)  Labs Reviewed - No data to display  PROCEDURES  Procedure(s) performed: None  Procedures  Critical Care performed: No  ____________________________________________   INITIAL IMPRESSION / ASSESSMENT AND PLAN / ED COURSE  Patient was given Decadron 10 mg IM while in the department. Patient had x-rays and a Toradol shot at the urgent care yesterday. He is also placed on prednisone 60 mg taper pack and tramadol 1 every 6 hours as needed for pain for the next 3 days. Patient is to follow-up with orthopedics and he was given information about [facility].  ____________________________________________   FINAL CLINICAL IMPRESSION(S) / ED DIAGNOSES  Final diagnoses:  Strain of right shoulder, initial encounter     ED Discharge Orders        Ordered    traMADol (ULTRAM) 50 MG tablet  Every 6 hours PRN     03/21/17 0842    predniSONE (DELTASONE) 10 MG tablet     03/21/17 [id number]       Note:  This document was prepared using Dragon voice recognition software and may include unintentional dictation errors.    Tommi RumpsSummers, Robyn Nohr L, PA-C 03/21/17 1343    Minna AntisPaduchowski, Kevin, MD 03/21/17 1450

## 2017-03-21 NOTE — ED Triage Notes (Signed)
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

## 2017-09-25 ENCOUNTER — Encounter: Payer: Self-pay | Admitting: Emergency Medicine

## 2017-09-25 ENCOUNTER — Other Ambulatory Visit: Payer: Self-pay

## 2017-09-25 ENCOUNTER — Ambulatory Visit
Admission: EM | Admit: 2017-09-25 | Discharge: 2017-09-25 | Disposition: A | Discharge status: Home / Self Care | Payer: Medicaid Other | Attending: Family Medicine | Admitting: Family Medicine

## 2017-09-25 DIAGNOSIS — S30863A Insect bite (nonvenomous) of scrotum and testes, initial encounter: Secondary | ICD-10-CM | POA: Diagnosis not present

## 2017-09-25 DIAGNOSIS — N492 Inflammatory disorders of scrotum: Secondary | ICD-10-CM | POA: Diagnosis not present

## 2017-09-25 MED ORDER — DOXYCYCLINE HYCLATE 100 MG PO TABS: 100.0000 mg | ORAL_TABLET | Freq: Two times a day (BID) | ORAL | 0 refills | Status: DC

## 2017-09-25 NOTE — Discharge Instructions (Signed)
Warm compresses to area every 1-2 hours for 15-20 min

## 2017-09-25 NOTE — ED Triage Notes (Signed)
Patient c/o tick bite to left testicle area. Patient stated he removed the tick last night. Patient now reports redness, swelling to the left testicle and groin area.

## 2017-09-25 NOTE — ED Provider Notes (Signed)
[facility]    CSN: [id number] Arrival date & time: 09/25/17  4098     History   Chief Complaint Chief Complaint  Patient presents with  . Tick Removal    HPI Michael [name] is a 27 y.o. male.   27 yo male with a c/o left scrotum redness, skin lesion and drainage since that started today. States yesterday night he pulled off a tick from that area on his scrotum. Denies any fevers, chills.   The history is provided by the patient.    Past Medical History:  Diagnosis Date  . TIA (transient ischemic attack)    4 years ago  . Wolff-Parkinson-White syndrome     There are no active problems to display for this patient.   Past Surgical History:  Procedure Laterality Date  . ABLATION     for wolff-parkinson white  . CARDIAC CATHETERIZATION Bilateral    2 years ago   . ELBOW FRACTURE SURGERY Left   . TONSILLECTOMY         Home Medications    Prior to Admission medications   Medication Sig Start Date End Date Taking? Authorizing Provider  cyclobenzaprine (FLEXERIL) 10 MG tablet Take 10 mg by mouth 3 (three) times daily as needed for muscle spasms.   Yes [provider]  predniSONE (DELTASONE) 10 MG tablet Take 6 tablets  today, on day 2 take 5 tablets, day 3 take 4 tablets, day 4 take 3 tablets, day 5 take  2 tablets and 1 tablet the last day 03/21/17  Yes Bridget Hartshorn L, PA-C  traMADol (ULTRAM) 50 MG tablet Take 1 tablet (50 mg total) by mouth every 6 (six) hours as needed. 03/21/17  Yes Tommi Rumps, PA-C  doxycycline (VIBRA-TABS) 100 MG tablet Take 1 tablet (100 mg total) by mouth 2 (two) times daily. 09/25/17   Payton Mccallum, MD    Family History Family History  Adopted: Yes    Social History Social History   Tobacco Use  . Smoking status: Current Every Day Smoker    Packs/day: 2.00    Types: Cigarettes  . Smokeless tobacco: Current User  Substance Use Topics  . Alcohol use: Not Currently    Frequency: Never  . Drug use: No       Allergies   Risperidone and related; Haldol [haloperidol]; and Lamictal [lamotrigine]   Review of Systems Review of Systems   Physical Exam Triage Vital Signs ED Triage Vitals  Enc Vitals Group     BP 09/25/17 1935 117/75     Pulse Rate 09/25/17 1935 81     Resp 09/25/17 1935 18     Temp 09/25/17 1935 98.3 F (36.8 C)     Temp Source 09/25/17 1935 Oral     SpO2 09/25/17 1935 98 %     Weight 09/25/17 1931 200 lb (90.7 kg)     Height 09/25/17 1931 6' (1.829 m)     Head Circumference --      Peak Flow --      Pain Score 09/25/17 1931 4     Pain Loc --      Pain Edu? --      Excl. in GC? --    No data found.  Updated Vital Signs BP 117/75 (BP Location: Left Arm)   Pulse 81   Temp 98.3 F (36.8 C) (Oral)   Resp 18   Ht 6' (1.829 m)   Wt 200 lb (90.7 kg)   SpO2 98%  BMI 27.12 kg/m   Visual Acuity Right Eye Distance:   Left Eye Distance:   Bilateral Distance:    Right Eye Near:   Left Eye Near:    Bilateral Near:     Physical Exam  Constitutional: He appears well-developed and well-nourished. No distress.  Skin: He is not diaphoretic.  Left scrotal skin with 1cm draining nodule, erythematous and tender as well as surrounding blanchable, tender, erythema and warmth  Nursing note and vitals reviewed.    UC Treatments / Results  Labs (all labs ordered are listed, but only abnormal results are displayed) Labs Reviewed - No data to display  EKG None  Radiology No results found.  Procedures Procedures (including critical care time)  Medications Ordered in UC Medications - No data to display  Initial Impression / Assessment and Plan / UC Course  I have reviewed the triage vital signs and the nursing notes.  Pertinent labs & imaging results that were available during my care of the patient were reviewed by me and considered in my medical decision making (see chart for details).      Final Clinical Impressions(s) / UC Diagnoses   Final  diagnoses:  Cellulitis, scrotum     Discharge Instructions     Warm compresses to area every 1-2 hours for 15-20 min    ED Prescriptions    Medication Sig Dispense Auth. Provider   doxycycline (VIBRA-TABS) 100 MG tablet Take 1 tablet (100 mg total) by mouth 2 (two) times daily. 20 tablet Payton Mccallumonty, Dajahnae Vondra, MD     1. diagnosis reviewed with patien 2. rx as per orders above; reviewed possible side effects, interactions, risks and benefits  3. Recommend supportive treatment with warm compresses to area 4. Follow-up prn if symptoms worsen or don't improve   Controlled Substance Prescriptions  Controlled Substance Registry consulted? Not Applicable   Payton Mccallumonty, Da Authement, MD 09/25/17 2005

## 2019-02-21 ENCOUNTER — Other Ambulatory Visit: Payer: Self-pay

## 2019-02-21 ENCOUNTER — Encounter: Payer: Self-pay | Admitting: Emergency Medicine

## 2019-02-21 ENCOUNTER — Emergency Department
Admission: EM | Admit: 2019-02-21 | Discharge: 2019-02-21 | Disposition: A | Discharge status: Home / Self Care | Payer: Medicaid Other | Attending: Emergency Medicine | Admitting: Emergency Medicine

## 2019-02-21 ENCOUNTER — Emergency Department: Payer: Medicaid Other

## 2019-02-21 DIAGNOSIS — M5431 Sciatica, right side: Secondary | ICD-10-CM

## 2019-02-21 DIAGNOSIS — F17210 Nicotine dependence, cigarettes, uncomplicated: Secondary | ICD-10-CM | POA: Diagnosis not present

## 2019-02-21 DIAGNOSIS — M5441 Lumbago with sciatica, right side: Secondary | ICD-10-CM | POA: Diagnosis not present

## 2019-02-21 DIAGNOSIS — Z79899 Other long term (current) drug therapy: Secondary | ICD-10-CM | POA: Insufficient documentation

## 2019-02-21 DIAGNOSIS — M5442 Lumbago with sciatica, left side: Secondary | ICD-10-CM | POA: Insufficient documentation

## 2019-02-21 DIAGNOSIS — M545 Low back pain: Secondary | ICD-10-CM | POA: Diagnosis present

## 2019-02-21 DIAGNOSIS — M5432 Sciatica, left side: Secondary | ICD-10-CM

## 2019-02-21 MED ORDER — ORPHENADRINE CITRATE 30 MG/ML IJ SOLN
60.0000 mg | Freq: Two times a day (BID) | INTRAMUSCULAR | Status: DC
  Administered 2019-02-21: 17:00:00 60 mg via INTRAMUSCULAR
  Filled 2019-02-21: qty 2

## 2019-02-21 MED ORDER — METHYLPREDNISOLONE SODIUM SUCC 125 MG IJ SOLR
125.0000 mg | Freq: Once | INTRAMUSCULAR | Status: AC
  Administered 2019-02-21: 125 mg via INTRAMUSCULAR
  Filled 2019-02-21: qty 2

## 2019-02-21 MED ORDER — LIDOCAINE 5 % EX PTCH
1.0000 | MEDICATED_PATCH | CUTANEOUS | Status: DC
  Administered 2019-02-21: 1 via TRANSDERMAL
  Filled 2019-02-21: qty 1

## 2019-02-21 MED ORDER — LIDOCAINE 5 % EX PTCH: 1.0000 | MEDICATED_PATCH | CUTANEOUS | 0 refills | Status: AC

## 2019-02-21 MED ORDER — PREDNISONE 50 MG PO TABS: ORAL_TABLET | ORAL | 0 refills | Status: DC

## 2019-02-21 MED ORDER — ACETAMINOPHEN 500 MG PO TABS
1000.0000 mg | ORAL_TABLET | Freq: Once | ORAL | Status: AC
  Administered 2019-02-21: 19:00:00 1000 mg via ORAL
  Filled 2019-02-21: qty 2

## 2019-02-21 MED ORDER — OXYCODONE HCL 5 MG PO TABS
5.0000 mg | ORAL_TABLET | Freq: Once | ORAL | Status: AC
  Administered 2019-02-21: 5 mg via ORAL
  Filled 2019-02-21: qty 1

## 2019-02-21 MED ORDER — CYCLOBENZAPRINE HCL 5 MG PO TABS: ORAL_TABLET | ORAL | 0 refills | Status: DC

## 2019-02-21 MED ORDER — KETOROLAC TROMETHAMINE 30 MG/ML IJ SOLN
30.0000 mg | Freq: Once | INTRAMUSCULAR | Status: AC
  Administered 2019-02-21: 30 mg via INTRAMUSCULAR
  Filled 2019-02-21: qty 1

## 2019-02-21 MED ORDER — OXYCODONE-ACETAMINOPHEN 5-325 MG PO TABS: 1.0000 | ORAL_TABLET | Freq: Once | ORAL | Status: DC

## 2019-02-21 MED ORDER — IBUPROFEN 600 MG PO TABS: 600.0000 mg | ORAL_TABLET | Freq: Four times a day (QID) | ORAL | 0 refills | Status: DC | PRN

## 2019-02-21 NOTE — ED Notes (Signed)
Patient transported to MRI 

## 2019-02-21 NOTE — ED Triage Notes (Addendum)
Pt here for lower back pain.  Severe pain with ambulation.  No loss bowel or bladder.  Pain down both legs.  Intermittent tingling sensation around pain site.  Walked in to triage, placed in wheelchair. 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

## 2019-02-21 NOTE — ED Provider Notes (Signed)
[facility] Emergency Department Provider Note  ____________________________________________  Time seen: Approximately 3:20 PM  I have reviewed the triage vital signs and the nursing notes.   HISTORY  Chief Complaint Back Pain    HPI Michael [name] is a 28 y.o. male presents to the emergency department for evaluation of low back pain chronically that worsened this morning.   He is having pain to both of his legs.  Pain shoots down his legs.   Patient climbs trees for a living.  He felt a pop to his back about 1.5 months ago.  He has had chronic back pain for years that he has not been evaluated for.  He was at work this morning and had climbed about 3 trees, when back pain became more severe. No bowel or bladder dysfunction or saddle anesthesias.  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  No fevers.  Past Medical History:  Diagnosis Date  . TIA (transient ischemic attack)    4 years ago  . Wolff-Parkinson-White syndrome     There are no active problems to display for this patient.   Past Surgical History:  Procedure Laterality Date  . ABLATION     for wolff-parkinson white  . CARDIAC CATHETERIZATION Bilateral    2 years ago   . ELBOW FRACTURE SURGERY Left   . TONSILLECTOMY      Prior to Admission medications   Medication Sig Start Date End Date Taking? Authorizing Provider  cyclobenzaprine (FLEXERIL) 5 MG tablet Take 1-2 tablets 3 times daily as needed 02/21/19   Laban Emperor, PA-C  doxycycline (VIBRA-TABS) 100 MG tablet Take 1 tablet (100 mg total) by mouth 2 (two) times daily. 09/25/17   Norval Gable, MD  ibuprofen (ADVIL) 600 MG tablet Take 1 tablet (600 mg total) by mouth every 6 (six) hours as needed. 02/21/19   Laban Emperor, PA-C  lidocaine (LIDODERM) 5 % Place 1 patch onto the skin daily. Remove & Discard patch within 12 hours or as directed by MD 02/21/19   Laban Emperor, PA-C  predniSONE (DELTASONE) 50 MG tablet  Take 1 tablet per day 02/21/19   Laban Emperor, PA-C  traMADol (ULTRAM) 50 MG tablet Take 1 tablet (50 mg total) by mouth every 6 (six) hours as needed. 03/21/17   Johnn Hai, PA-C    Allergies Risperidone and related, Haldol [haloperidol], and Lamictal [lamotrigine]  Family History  Adopted: Yes    Social History Social History   Tobacco Use  . Smoking status: Current Every Day Smoker    Packs/day: 2.00    Types: Cigarettes  . Smokeless tobacco: Current User  Substance Use Topics  . Alcohol use: Not Currently    Frequency: Never  . Drug use: No     Review of Systems  Constitutional: No fever/chills Cardiovascular: No chest pain. Respiratory: No SOB. Gastrointestinal: No abdominal pain.  No nausea, no vomiting.  Musculoskeletal: Positive for back pain. Skin: Negative for rash, abrasions, lacerations, ecchymosis. Neurological: Negative for numbness or tingling   ____________________________________________   PHYSICAL EXAM:  VITAL SIGNS: ED Triage Vitals  Enc Vitals Group     BP 02/21/19 1444 123/68     Pulse Rate 02/21/19 1444 88     Resp 02/21/19 1444 16     Temp 02/21/19 1444 98.8 F (37.1 C)     Temp Source 02/21/19 1444 Oral     SpO2 02/21/19 1444 98 %     Weight 02/21/19  1440 200 lb (90.7 kg)     Height 02/21/19 1440 6' (1.829 m)     Head Circumference --      Peak Flow --      Pain Score 02/21/19 1440 6     Pain Loc --      Pain Edu? --      Excl. in GC? --      Constitutional: Alert and oriented. Well appearing and in no acute distress. Eyes: Conjunctivae are normal. PERRL. EOMI. Head: Atraumatic. ENT:      Ears:      Nose: No congestion/rhinnorhea.      Mouth/Throat: Mucous membranes are moist.  Neck: No stridor. Cardiovascular: Normal rate, regular rhythm.  Good peripheral circulation. Respiratory: Normal respiratory effort without tachypnea or retractions. Lungs CTAB. Good air entry to the bases with no decreased or absent breath  sounds. Gastrointestinal: Bowel sounds 4 quadrants. Soft and nontender to palpation. No guarding or rigidity. No palpable masses. No distention. Musculoskeletal: Full range of motion to all extremities. No gross deformities appreciated.  Tenderness to palpation diffusely through lumbar spine.  Strength equal in lower extremities bilaterally.  No foot drop.  Weightbearing.  Antalgic gait. Neurologic:  Normal speech and language. No gross focal neurologic deficits are appreciated.  Skin:  Skin is warm, dry and intact. No rash noted. Psychiatric: Mood and affect are normal. Speech and behavior are normal. Patient exhibits appropriate insight and judgement.   ____________________________________________   LABS (all labs ordered are listed, but only abnormal results are displayed)  Labs Reviewed - No data to display ____________________________________________  EKG   ____________________________________________  RADIOLOGY Lexine BatonI, Dickson Kostelnik, personally viewed and evaluated these images (plain radiographs) as part of my medical decision making, as well as reviewing the written report by the radiologist.  Mr Lumbar Spine Wo Contrast  Result Date: 02/21/2019 CLINICAL DATA:  Low back and bilateral leg pain. EXAM: MRI LUMBAR SPINE WITHOUT CONTRAST TECHNIQUE: Multiplanar, multisequence MR imaging of the lumbar spine was performed. No intravenous contrast was administered. COMPARISON:  None. FINDINGS: Segmentation: There are five lumbar type vertebral bodies. The last full intervertebral disc space is labeled L5-S1. Alignment:  Normal Vertebrae:  Normal marrow signal.  No bone lesions or fractures. Conus medullaris and cauda equina: Conus extends to the L1 level. Conus and cauda equina appear normal. Paraspinal and other soft tissues: No significant paraspinal or retroperitoneal findings. Disc levels: L1-2: No significant findings. L2-3: No significant findings. L3-4: No significant findings. L4-5:  Disc desiccation and disc space narrowing. There is a shallow central disc protrusion with mass effect on the thecal sac slightly asymmetric right. Mild bilateral lateral recess encroachment and potential irritation of the L5 nerve roots, right greater than left. The exiting L4 nerve roots appear normal. L5-S1: Disc desiccation, degeneration and disc space narrowing. There is a small central disc protrusion barely contacting the ventral thecal sac. No direct neural compression. No foraminal stenosis. IMPRESSION: 1. Disc protrusions at L4-5 and L5-S1 as detailed above. 2. The other intervertebral disc spaces are unremarkable. Electronically Signed   By: Rudie MeyerP.  Gallerani M.D.   On: 02/21/2019 17:01    ____________________________________________    PROCEDURES  Procedure(s) performed:    Procedures    Medications  orphenadrine (NORFLEX) injection 60 mg (60 mg Intramuscular Given 02/21/19 1701)  lidocaine (LIDODERM) 5 % 1 patch (1 patch Transdermal Patch Applied 02/21/19 1701)  methylPREDNISolone sodium succinate (SOLU-MEDROL) 125 mg/2 mL injection 125 mg (125 mg Intramuscular Given 02/21/19 1702)  ketorolac (  TORADOL) 30 MG/ML injection 30 mg (30 mg Intramuscular Given 02/21/19 1726)  acetaminophen (TYLENOL) tablet 1,000 mg (1,000 mg Oral Given 02/21/19 1840)  oxyCODONE (Oxy IR/ROXICODONE) immediate release tablet 5 mg (5 mg Oral Given 02/21/19 1840)     ____________________________________________   INITIAL IMPRESSION / ASSESSMENT AND PLAN / ED COURSE  Pertinent labs & imaging results that were available during my care of the patient were reviewed by me and considered in my medical decision making (see chart for details).  Review of the [place] CSRS was performed in accordance of the [facility] prior to dispensing any controlled drugs.   Patient's diagnosis is consistent with sciatica.  Vital signs and exam are reassuring.  MRI findings were discussed with patient.  Pain improved after  Solu-Medrol, Norflex, Toradol, oxycodone and Tylenol.  Patient is able to up and ambulate himself and change his clothes.  Patient will be discharged home with prescriptions for prednisone, Flexeril, Lidoderm, Advil. Patient is to follow up with neurosurgery as directed.  Referral was given.  Patient is given ED precautions to return to the ED for any worsening or new symptoms.  Zebastian Carico was evaluated in Emergency Department on 02/21/2019 for the symptoms described in the history of present illness. He was evaluated in the context of the global COVID-19 pandemic, which necessitated consideration that the patient might be at risk for infection with the SARS-CoV-2 virus that causes COVID-19. Institutional protocols and algorithms that pertain to the evaluation of patients at risk for COVID-19 are in a state of rapid change based on information released by regulatory bodies including the CDC and federal and state organizations. These policies and algorithms were followed during the patient's care in the ED.   ____________________________________________  FINAL CLINICAL IMPRESSION(S) / ED DIAGNOSES  Final diagnoses:  Bilateral sciatica      NEW MEDICATIONS STARTED DURING THIS VISIT:  ED Discharge Orders         Ordered    predniSONE (DELTASONE) 50 MG tablet     02/21/19 1933    cyclobenzaprine (FLEXERIL) 5 MG tablet     02/21/19 1933    ibuprofen (ADVIL) 600 MG tablet  Every 6 hours PRN     02/21/19 1933    lidocaine (LIDODERM) 5 %  Every 24 hours     02/21/19 1933              This chart was dictated using voice recognition software/Dragon. Despite best efforts to proofread, errors can occur which can change the meaning. Any change was purely unintentional.    Enid Derry, PA-C 02/21/19 2344    Arnaldo Natal, MD 02/21/19 2352

## 2020-09-04 ENCOUNTER — Other Ambulatory Visit: Payer: Self-pay

## 2020-09-04 ENCOUNTER — Ambulatory Visit
Admission: EM | Admit: 2020-09-04 | Discharge: 2020-09-04 | Disposition: A | Discharge status: Home / Self Care | Payer: Medicaid Other | Attending: Physician Assistant | Admitting: Physician Assistant

## 2020-09-04 ENCOUNTER — Encounter: Payer: Self-pay | Admitting: Emergency Medicine

## 2020-09-04 DIAGNOSIS — S161XXA Strain of muscle, fascia and tendon at neck level, initial encounter: Secondary | ICD-10-CM | POA: Diagnosis not present

## 2020-09-04 DIAGNOSIS — K047 Periapical abscess without sinus: Secondary | ICD-10-CM | POA: Diagnosis not present

## 2020-09-04 MED ORDER — TIZANIDINE HCL 4 MG PO TABS: 4.0000 mg | ORAL_TABLET | Freq: Three times a day (TID) | ORAL | 0 refills | Status: AC | PRN

## 2020-09-04 MED ORDER — AMOXICILLIN-POT CLAVULANATE 875-125 MG PO TABS: 1.0000 | ORAL_TABLET | Freq: Two times a day (BID) | ORAL | 0 refills | Status: AC

## 2020-09-04 MED ORDER — NAPROXEN 500 MG PO TABS: 500.0000 mg | ORAL_TABLET | Freq: Two times a day (BID) | ORAL | 0 refills | Status: AC

## 2020-09-04 NOTE — ED Provider Notes (Signed)
[facility]    CSN: [id number] Arrival date & time: 09/04/20  1124      History   Chief Complaint Chief Complaint  Patient presents with  . Dental Pain    HPI Michael [name] is a 30 y.o. male presenting for 2 complaints.  First he states that he has had swelling and pain of the teeth of the left lower side of his mouth for the past 2 weeks.  He admits that he was hit by a tree branch years ago and has had dental problems since.  He says he has had infections in his teeth before.  He believes he may have an infection now.  He has been taking ibuprofen and Tylenol and is about the pain.  He has not had any fevers.  Patient does not currently have a dentist.  Additionally, he complains of left-sided neck pain that is worse with movement.  He has tried the ibuprofen and Tylenol and that has not helped.  He denies any specific injury.  Denies any radiation of pain to his extremities.  No numbness, tingling or weakness.  No history of neck problems.  No other concerns today.  HPI  Past Medical History:  Diagnosis Date  . TIA (transient ischemic attack)    4 years ago  . Wolff-Parkinson-White syndrome     There are no problems to display for this patient.   Past Surgical History:  Procedure Laterality Date  . ABLATION     for wolff-parkinson white  . CARDIAC CATHETERIZATION Bilateral    2 years ago   . ELBOW FRACTURE SURGERY Left   . TONSILLECTOMY         Home Medications    Prior to Admission medications   Medication Sig Start Date End Date Taking? Authorizing Provider  amoxicillin-clavulanate (AUGMENTIN) 875-125 MG tablet Take 1 tablet by mouth every 12 (twelve) hours for 10 days. 09/04/20 09/14/20 Yes Eusebio Friendly B, PA-C  naproxen (NAPROSYN) 500 MG tablet Take 1 tablet (500 mg total) by mouth 2 (two) times daily for 10 days. 09/04/20 09/14/20 Yes Eusebio Friendly B, PA-C  tiZANidine (ZANAFLEX) 4 MG tablet Take 1 tablet (4 mg total) by mouth every 8 (eight) hours  as needed for up to 7 days for muscle spasms. 09/04/20 09/11/20 Yes Eusebio Friendly B, PA-C  lidocaine (LIDODERM) 5 % Place 1 patch onto the skin daily. Remove & Discard patch within 12 hours or as directed by MD 02/21/19   Enid Derry, PA-C    Family History Family History  Adopted: Yes    Social History Social History   Tobacco Use  . Smoking status: Current Every Day Smoker    Packs/day: 2.00    Types: Cigarettes  . Smokeless tobacco: Current User  Vaping Use  . Vaping Use: Never used  Substance Use Topics  . Alcohol use: Not Currently  . Drug use: No     Allergies   Risperidone and related, Haldol [haloperidol], and Lamictal [lamotrigine]   Review of Systems Review of Systems  Constitutional: Negative for fatigue and fever.  HENT: Positive for dental problem. Negative for facial swelling.   Musculoskeletal: Positive for neck pain. Negative for neck stiffness.  Skin: Negative for color change, rash and wound.  Neurological: Negative for weakness and numbness.     Physical Exam Triage Vital Signs ED Triage Vitals  Enc Vitals Group     BP 09/04/20 1149 120/87     Pulse Rate 09/04/20 1149 71  Resp 09/04/20 1149 16     Temp 09/04/20 1149 98 F (36.7 C)     Temp Source 09/04/20 1149 Oral     SpO2 09/04/20 1149 100 %     Weight 09/04/20 1147 200 lb (90.7 kg)     Height 09/04/20 1147 6' (1.829 m)     Head Circumference --      Peak Flow --      Pain Score 09/04/20 1147 10     Pain Loc --      Pain Edu? --      Excl. in GC? --    No data found.  Updated Vital Signs BP 120/87 (BP Location: Left Arm)   Pulse 71   Temp 98 F (36.7 C) (Oral)   Resp 16   Ht 6' (1.829 m)   Wt 200 lb (90.7 kg)   SpO2 100%   BMI 27.12 kg/m       Physical Exam Vitals and nursing note reviewed.  Constitutional:      General: He is not in acute distress.    Appearance: Normal appearance. He is well-developed. He is not ill-appearing.  HENT:     Head: Normocephalic  and atraumatic.     Mouth/Throat:     Dentition: Abnormal dentition. Dental tenderness, gingival swelling and dental caries present.   Eyes:     General: No scleral icterus.    Conjunctiva/sclera: Conjunctivae normal.  Cardiovascular:     Rate and Rhythm: Normal rate and regular rhythm.  Pulmonary:     Effort: Pulmonary effort is normal. No respiratory distress.  Musculoskeletal:     Cervical back: Normal range of motion and neck supple. Tenderness (TTP left SCM muscle and left paracervical muscles) present. No rigidity.  Skin:    General: Skin is warm and dry.  Neurological:     General: No focal deficit present.     Mental Status: He is alert. Mental status is at baseline.     Motor: No weakness.     Gait: Gait normal.  Psychiatric:        Mood and Affect: Mood normal.        Behavior: Behavior normal.        Thought Content: Thought content normal.      UC Treatments / Results  Labs (all labs ordered are listed, but only abnormal results are displayed) Labs Reviewed - No data to display  EKG   Radiology No results found.  Procedures Procedures (including critical care time)  Medications Ordered in UC Medications - No data to display  Initial Impression / Assessment and Plan / UC Course  I have reviewed the triage vital signs and the nursing notes.  Pertinent labs & imaging results that were available during my care of the patient were reviewed by me and considered in my medical decision making (see chart for details).   30 year old male presenting for dental pain and neck pain.  Exam of his mouth is consistent with infected dental caries.  Treating him at this time with Augmentin.  Neck pain likely due to cervical strain and muscle spasms.  Treating at this time with naproxen and tizanidine.  Also encouraged warm compresses and stretches.  Advised him to follow-up with a dentist as soon as possible about his teeth.  Reviewed ED red flag signs and symptoms related  to his neck pain.  He declined a work note.  Advised to follow-up with [place] as needed.   Final Clinical Impressions(s) / UC  Diagnoses   Final diagnoses:  Dental infection  Strain of neck muscle, initial encounter     Discharge Instructions     DENTAL INFECTION: Follow up with dentist at the next available appointment.  In the meantime, we will cover for dental infection with Augmentin.  Take NSAIDs/Tylenol for pain relief. Consider Orajel also. May ice the area. Follow up with dentist in the next few days. In some cases, the tooth may needed to be extracted. Follow up with [place] or ER sooner if the condition worsens before the dental appointment. If they develop a fever, significant soft tissue swelling, or worse pain, go to ER   NECK PAIN: Stressed avoiding painful activities. This can exacerbate your symptoms and make them worse.  May apply heat to the areas of pain for some relief. Use medications as directed. Be aware of which medications make you drowsy and do not drive or operate any kind of heavy machinery while using the medication (ie pain medications or muscle relaxers). F/U with PCP for reexamination or return sooner if condition worsens or does not begin to improve over the next few days.   NECK PAIN RED FLAGS: If symptoms get worse than they are right now, you should come back sooner for re-evaluation. If you have increased numbness/ tingling or notice that the numbness/tingling is affecting the legs or saddle region, go to ER. If you ever lose continence go to ER.        ED Prescriptions    Medication Sig Dispense Auth. Provider   amoxicillin-clavulanate (AUGMENTIN) 875-125 MG tablet Take 1 tablet by mouth every 12 (twelve) hours for 10 days. 20 tablet Eusebio Friendly B, PA-C   naproxen (NAPROSYN) 500 MG tablet Take 1 tablet (500 mg total) by mouth 2 (two) times daily for 10 days. 20 tablet Eusebio Friendly B, PA-C   tiZANidine (ZANAFLEX) 4 MG tablet Take 1 tablet (4 mg total) by mouth  every 8 (eight) hours as needed for up to 7 days for muscle spasms. 20 tablet Gareth Morgan     PDMP not reviewed this encounter.   Shirlee Latch, PA-C 09/04/20 1241

## 2020-09-04 NOTE — ED Triage Notes (Signed)
Patient c/o pain and swelling on the left side of his face for the past 2 weeks.  Patient denies fevers.

## 2020-09-04 NOTE — Discharge Instructions (Addendum)
DENTAL INFECTION: Follow up with dentist at the next available appointment.  In the meantime, we will cover for dental infection with Augmentin.  Take NSAIDs/Tylenol for pain relief. Consider Orajel also. May ice the area. Follow up with dentist in the next few days. In some cases, the tooth may needed to be extracted. Follow up with [place] or ER sooner if the condition worsens before the dental appointment. If they develop a fever, significant soft tissue swelling, or worse pain, go to ER   NECK PAIN: Stressed avoiding painful activities. This can exacerbate your symptoms and make them worse.  May apply heat to the areas of pain for some relief. Use medications as directed. Be aware of which medications make you drowsy and do not drive or operate any kind of heavy machinery while using the medication (ie pain medications or muscle relaxers). F/U with PCP for reexamination or return sooner if condition worsens or does not begin to improve over the next few days.   NECK PAIN RED FLAGS: If symptoms get worse than they are right now, you should come back sooner for re-evaluation. If you have increased numbness/ tingling or notice that the numbness/tingling is affecting the legs or saddle region, go to ER. If you ever lose continence go to ER.

## 2021-08-22 ENCOUNTER — Ambulatory Visit
Admission: EM | Admit: 2021-08-22 | Discharge: 2021-08-22 | Disposition: A | Discharge status: Home / Self Care | Payer: Medicare Other | Attending: Internal Medicine | Admitting: Internal Medicine

## 2021-08-22 DIAGNOSIS — H1031 Unspecified acute conjunctivitis, right eye: Secondary | ICD-10-CM | POA: Diagnosis not present

## 2021-08-22 DIAGNOSIS — J019 Acute sinusitis, unspecified: Secondary | ICD-10-CM

## 2021-08-22 MED ORDER — AMOXICILLIN-POT CLAVULANATE 875-125 MG PO TABS: 1.0000 | ORAL_TABLET | Freq: Two times a day (BID) | ORAL | 0 refills | Status: AC

## 2021-08-22 NOTE — ED Provider Notes (Signed)
?[facility] ? ? ? ?CSN: [id number] ?Arrival date & time: 08/22/21  0810 ? ? ?  ? ?History   ?Chief Complaint ?Chief Complaint  ?Patient presents with  ? Eye Problem  ? ? ?HPI ?Michael [name] is a 31 y.o. male.  He presents today with onset yesterday afternoon of mild redness and discomfort in the right eye.   ? ?He slept in his contacts last night, as he sometimes does.  This morning, he cannot locate the right contact lens.   ? ?He has mild discomfort in the right eye, 2/10, not clearly a foreign body sensation however, and poorly localized.  The right eye redness is a little more pronounced this morning.  He has mild photophobia.  The eye is watering a little bit, although this is not witnessed on exam; he does not dab or blot the eye. ? ?Also reports a sinus infection for the last couple of months, blowing his nose a lot, with yellow-green material that smells bad.  Some cough, some chest tightness.  Smokes.  No fever. ? ? ?Eye Problem ? ?Past Medical History:  ?Diagnosis Date  ? TIA (transient ischemic attack)   ? 4 years ago  ? Wolff-Parkinson-White syndrome   ? ? ?There are no problems to display for this patient. ? ? ?Past Surgical History:  ?Procedure Laterality Date  ? ABLATION    ? for wolff-parkinson white  ? CARDIAC CATHETERIZATION Bilateral   ? 2 years ago   ? ELBOW FRACTURE SURGERY Left   ? TONSILLECTOMY    ? ? ? ? ? ?Home Medications   ? ?Prior to Admission medications   ?Medication Sig Start Date End Date Taking? Authorizing Provider  ?amoxicillin-clavulanate (AUGMENTIN) 875-125 MG tablet Take 1 tablet by mouth 2 (two) times daily for 10 days. 08/22/21 09/01/21 Yes Isa Rankin, MD  ?lidocaine (LIDODERM) 5 % Place 1 patch onto the skin daily. Remove & Discard patch within 12 hours or as directed by MD 02/21/19  Yes Enid Derry, PA-C  ? ? ?Family History ?Family History  ?Adopted: Yes  ? ? ?Social History ?Social History  ? ?Tobacco Use  ? Smoking status: Every Day  ?  Packs/day: 2.00   ?  Types: Cigarettes  ? Smokeless tobacco: Current  ?Vaping Use  ? Vaping Use: Never used  ?Substance Use Topics  ? Alcohol use: Not Currently  ? Drug use: No  ? ? ? ?Allergies   ?Risperidone and related, Haldol [haloperidol], and Lamictal [lamotrigine] ? ? ?Review of Systems ?Review of Systems  see HPI ? ? ?Physical Exam ?Triage Vital Signs ?ED Triage Vitals  ?Enc Vitals Group  ?   BP 08/22/21 0828 112/73  ?   Pulse Rate 08/22/21 0828 76  ?   Resp 08/22/21 0828 18  ?   Temp 08/22/21 0828 98.2 ?F (36.8 ?C)  ?   Temp Source 08/22/21 0828 Oral  ?   SpO2 08/22/21 0828 99 %  ?   Weight 08/22/21 0827 200 lb (90.7 kg)  ?   Height 08/22/21 0827 6' (1.829 m)  ?   Pain Score 08/22/21 0826 2  ?   Pain Loc --   ? ?Updated Vital Signs ?BP 112/73 (BP Location: Left Arm)   Pulse 76   Temp 98.2 ?F (36.8 ?C) (Oral)   Resp 18   Ht 6' (1.829 m)   Wt 90.7 kg   SpO2 99%   BMI 27.12 kg/m?  ? ?Visual Acuity ?Right Eye Distance:  Pt could not see chart (Pt is not wearing contacts) ?Left Eye Distance: 20/25 (Pt wears contacts) ?Bilateral Distance: 20/40 ? ?Physical Exam ?Constitutional:   ?   General: He is not in acute distress. ?   Appearance: He is not ill-appearing or toxic-appearing.  ?   Comments: Good hygiene  ?HENT:  ?   Head: Atraumatic.  ?   Comments: Bilateral TMs mildly dull, no erythema ?Moderate nasal congestion bilaterally ?Posterior pharynx moderately injected ?   Mouth/Throat:  ?   Mouth: Mucous membranes are moist.  ?Eyes:  ? ?   Comments: PERRL; conjugate gaze observed ?Mild to moderate conjunctival injection on the right, as in diagram.  Bilateral upper and lower lids are unremarkable.  No mucopurulent discharge appreciated.  No corneal lesion, no abrasion, no ulcer appreciated on direct or fluorescein exams.  Right upper lid everted, and inspected under, without visualization of a contact lens.  ?Cardiovascular:  ?   Rate and Rhythm: Normal rate and regular rhythm.  ?Pulmonary:  ?   Effort: Pulmonary effort is  normal. No respiratory distress.  ?   Breath sounds: No wheezing or rhonchi.  ?Abdominal:  ?   Tenderness: There is no abdominal tenderness.  ?Musculoskeletal:  ?   Cervical back: Neck supple.  ?   Comments: Walked into the urgent care independently  ?Skin: ?   General: Skin is warm and dry.  ?   Comments: No cyanosis  ?Neurological:  ?   Mental Status: He is alert.  ?   Comments: Face symmetric, except for conjunctival injection on the right, speech is clear, coherent, logical  ? ? ?UC Treatments / Results  ?Labs ?(all labs ordered are listed, but only abnormal results are displayed) ?Labs Reviewed - No data to display ?NA ? ?EKG ?NA ? ?Radiology ?No results found. ?NA ? ?Procedures ?Procedures (including critical care time) ? ?Right eye irrigated per clinical staff with normal saline via Lequita Halt lens, without improvement in mild eye discomfort, or return of contact lens ? ?Medications Ordered in UC ?Medications - No data to display ?NA ? ? ?Final Clinical Impressions(s) / UC Diagnoses  ? ?Final diagnoses:  ?Acute conjunctivitis of right eye, unspecified acute conjunctivitis type  ?Acute sinusitis with symptoms > 10 days  ? ? ? ?Discharge Instructions   ? ?  ?Exam and symptoms today suggest a sinus infection could be contributing to your right eye irritation.  No corneal lesion appreciated on exam today.  Prescription for amoxicillin/clavulanate was sent to the pharmacy.  Please followup with your eye care provider if eye irritation does not subside over the next couple days. Push fluids and rest.  Take tylenol or advil otc as needed for fever, discomfort.  Eat fruits and vegetables to help your immune system do its best work.  Anticipate gradual improvement over the next several days.  Recheck for new fever >100.5, increasing phlegm production/nasal discharge, or if not starting to improve in a few days.    ? ? ? ? ?ED Prescriptions   ? ? Medication Sig Dispense Auth. Provider  ? amoxicillin-clavulanate (AUGMENTIN)  875-125 MG tablet Take 1 tablet by mouth 2 (two) times daily for 10 days. 20 tablet Isa Rankin, MD  ? ?  ? ?PDMP not reviewed this encounter. ?  ?Isa Rankin, MD ?08/23/21 1151 ? ?

## 2021-08-22 NOTE — ED Triage Notes (Signed)
Pt c/o right eye redness and pain. Pt states that the eye was dark red and had trouble seeing. Pt does wear contacts. ? ?Pt states he has had a sinus infection for the last 2 months.  ? ?Pt is not wearing a contact in the right eye. Pt fell asleep with the contacts in last night and is unsure if it is in his eye.  ? ?Pt states that the redness started yesterday afternoon before sleeping with the contacts in.  ?

## 2021-08-22 NOTE — Discharge Instructions (Addendum)
Exam and symptoms today suggest a sinus infection could be contributing to your right eye irritation.  No corneal lesion appreciated on exam today.  Prescription for amoxicillin/clavulanate was sent to the pharmacy.  Please followup with your eye care provider if eye irritation does not subside over the next couple days. Push fluids and rest.  Take tylenol or advil otc as needed for fever, discomfort.  Eat fruits and vegetables to help your immune system do its best work.  Anticipate gradual improvement over the next several days.  Recheck for new fever >100.5, increasing phlegm production/nasal discharge, or if not starting to improve in a few days.    ?

## 2022-01-29 ENCOUNTER — Ambulatory Visit: Payer: Medicare Other | Admitting: Family Medicine

## 2022-04-26 ENCOUNTER — Ambulatory Visit
Admission: EM | Admit: 2022-04-26 | Discharge: 2022-04-26 | Disposition: A | Discharge status: Home / Self Care | Payer: Medicare Other | Attending: Family Medicine | Admitting: Family Medicine

## 2022-04-26 DIAGNOSIS — K029 Dental caries, unspecified: Secondary | ICD-10-CM

## 2022-04-26 DIAGNOSIS — K047 Periapical abscess without sinus: Secondary | ICD-10-CM

## 2022-04-26 MED ORDER — PENICILLIN V POTASSIUM 500 MG PO TABS: 500.0000 mg | ORAL_TABLET | Freq: Four times a day (QID) | ORAL | 0 refills | Status: AC

## 2022-04-26 NOTE — Discharge Instructions (Addendum)
At this time there is no abscess to be drained. You were prescribed an antibiotic. Please take this exactly as directed and do not stop taking it until the entire course of medicine is finished, even if you begin to feel better before finishing the course.  Take 2 tablets of Tylenol with 3-4 tablets of ibuprofen for pain. Continue gargling with warm salt water.   Schedule an appointment with your dentist or call local dentists to see if they take your insurance or can do a payment payment plan.  [place] and [place] [facility] dent[facility] are options. Consider [facility] prior to your dental appointment.

## 2022-04-26 NOTE — ED Provider Notes (Signed)
[facility]    CSN: [id number] Arrival date & time: 04/26/22  1024      History   Chief Complaint Chief Complaint  Patient presents with   Dental Problem    Swollen knot in jaw - Entered by patient    HPI Michael [name] is a 32 y.o. male.   HPI  Michael [name] presents for dental pain and swelling of his jaw.  Reports pain in his right lower jaw and also on the left lower jaw.  For the past 2 weeks, he has had a knot there.  Endorses headache, nausea, neck pain.  Denies fever and vomiting.  Reports previous symptoms in the past and was told he had an infection in his tooth.  States he does not have the money to go and see a dentist right now.  He has been taken Tylenol and ibuprofen that have not been helping.  He is gargled with solution in his mouth.  Partner states that he has not been eating well due to his pain.   Past Medical History:  Diagnosis Date   TIA (transient ischemic attack)    4 years ago   Wolff-Parkinson-White syndrome     Patient Active Problem List   Diagnosis Date Noted   Bipolar 1 disorder (HCC) 09/08/2012   Depression 09/08/2012   WPW (Wolff-Parkinson-White syndrome) 09/08/2012   Opiate abuse, episodic (HCC) 08/22/2012   Pain, elbow joint 08/22/2012   Psychiatric disorder 08/22/2012    Past Surgical History:  Procedure Laterality Date   ABLATION     for wolff-parkinson white   CARDIAC CATHETERIZATION Bilateral    2 years ago    ELBOW FRACTURE SURGERY Left    TONSILLECTOMY         Home Medications    Prior to Admission medications   Medication Sig Start Date End Date Taking? Authorizing Provider  citalopram (CELEXA) 20 MG tablet Take 20 mg by mouth.   Yes [provider]  cyclobenzaprine (FLEXERIL) 10 MG tablet Take 10 mg by mouth 2 (two) times daily as needed. 01/01/22  Yes [provider]  gabapentin (NEURONTIN) 300 MG capsule Take by mouth. 03/02/10  Yes [provider]  indomethacin (INDOCIN) 50 MG  capsule 1 capsule po Q12 hrs prn pain. Take with food. 07/20/14  Yes [provider]  ketorolac (TORADOL) 10 MG tablet Take 10 mg by mouth every 6 (six) hours as needed. 01/01/22  Yes [provider]  lidocaine (LIDODERM) 5 % Place 1 patch onto the skin daily. Remove & Discard patch within 12 hours or as directed by MD 02/21/19  Yes Enid Derry, PA-C  penicillin v potassium (VEETID) 500 MG tablet Take 1 tablet (500 mg total) by mouth 4 (four) times daily for 10 days. 04/26/22 05/06/22 Yes Liane Tribbey, Seward Meth, DO    Family History Family History  Adopted: Yes    Social History Social History   Tobacco Use   Smoking status: Every Day    Packs/day: 2.00    Types: Cigarettes   Smokeless tobacco: Current  Vaping Use   Vaping Use: Never used  Substance Use Topics   Alcohol use: Not Currently   Drug use: No     Allergies   Risperidone and related, Tramadol, Haloperidol, and Lamotrigine   Review of Systems Review of Systems : negative unless otherwise stated in HPI.      Physical Exam Triage Vital Signs ED Triage Vitals  Enc Vitals Group     BP 04/26/22 1129 109/71  Pulse Rate 04/26/22 1129 84     Resp 04/26/22 1129 16     Temp 04/26/22 1129 98.1 F (36.7 C)     Temp Source 04/26/22 1129 Oral     SpO2 04/26/22 1129 94 %     Weight 04/26/22 1128 200 lb (90.7 kg)     Height 04/26/22 1128 6' (1.829 m)     Head Circumference --      Peak Flow --      Pain Score 04/26/22 1127 4     Pain Loc --      Pain Edu? --      Excl. in [place]? --    No data found.  Updated Vital Signs BP 109/71 (BP Location: Left Arm)   Pulse 84   Temp 98.1 F (36.7 C) (Oral)   Resp 16   Ht 6' (1.829 m)   Wt 90.7 kg   SpO2 94%   BMI 27.12 kg/m   Visual Acuity Right Eye Distance:   Left Eye Distance:   Bilateral Distance:    Right Eye Near:   Left Eye Near:    Bilateral Near:     Physical Exam  GEN:     alert, uncomfortable appearing male in no distress    HENT:   mucus membranes moist, oropharyngeal without lesions exudates or erythema, nasal discharge, bilateral TM normal, posterior left last molar tender to percussion with green purulence, right last molar with erythematous gum, no trismus, no secretion pooling, no palpable induration and no visible swelling of the floor the mouth, normal jaw movement without difficulty, left facial swelling, multiple dental caries and crack offs, poor dentition EYES:   pupils equal and reactive, no scleral injection or discharge NECK:  normal ROM, no meningismus   RESP:  no increased work of breathing CVS:   regular rate  Skin:   warm and dry, no rash or skin changes of on external jaw    UC Treatments / Results  Labs (all labs ordered are listed, but only abnormal results are displayed) Labs Reviewed - No data to display  EKG   Radiology No results found.  Procedures Procedures (including critical care time)  Medications Ordered in UC Medications - No data to display  Initial Impression / Assessment and Plan / UC Course  I have reviewed the triage vital signs and the nursing notes.  Pertinent labs & imaging results that were available during my care of the patient were reviewed by me and considered in my medical decision making (see chart for details).     Pt is a 32 y.o. male who presents for 2 weeks of dental pain. Michael [name] is afebrile here without recent antipyretics. Satting well on room air. Overall pt is uncomfortable appearing, well hydrated, without respiratory distress.  Dental exam concerning for multiple dental abscesses in the setting of poor dentition. - continue Tylenol with Motrin as needed for discomfort - Gargle with salt water several times a day - Handout provided  - Establish care with a dentist.  Given information for [employer] and [employer] for payment plan options  - Discussed  ED precautions, understanding voiced.   Discussed MDM, treatment plan and plan for  follow-up with patient/parent who agrees with plan.   Final Clinical Impressions(s) / UC Diagnoses   Final diagnoses:  Dental caries  Dental abscess     Discharge Instructions      At this time there is no abscess to be drained. You were  prescribed an antibiotic. Please take this exactly as directed and do not stop taking it until the entire course of medicine is finished, even if you begin to feel better before finishing the course.  Take 2 tablets of Tylenol with 3-4 tablets of ibuprofen for pain. Continue gargling with warm salt water.   Schedule an appointment with your dentist or call local dentists to see if they take your insurance or can do a payment payment plan.  [place] and [place] of dentistry are options. Consider [facility] prior to your dental appointment.         ED Prescriptions     Medication Sig Dispense Auth. Provider   penicillin v potassium (VEETID) 500 MG tablet Take 1 tablet (500 mg total) by mouth 4 (four) times daily for 10 days. 40 tablet Darianna Amy, DO      I have reviewed the PDMP during this encounter.   Lyndee Hensen, DO 04/26/22 1235

## 2022-04-26 NOTE — ED Triage Notes (Signed)
Pt c/o swollen knot in both sides of his jaw x7 days. Radiates to head. Denies any injury or trauma.

## 2022-07-31 ENCOUNTER — Telehealth: Payer: Medicare Other | Admitting: Physician Assistant

## 2022-07-31 DIAGNOSIS — A084 Viral intestinal infection, unspecified: Secondary | ICD-10-CM | POA: Diagnosis not present

## 2022-07-31 MED ORDER — ONDANSETRON 4 MG PO TBDP: 4.0000 mg | ORAL_TABLET | Freq: Three times a day (TID) | ORAL | 0 refills | Status: AC | PRN

## 2022-07-31 MED ORDER — LOPERAMIDE HCL 2 MG PO TABS: 2.0000 mg | ORAL_TABLET | Freq: Four times a day (QID) | ORAL | 0 refills | Status: AC | PRN

## 2022-07-31 NOTE — Patient Instructions (Signed)
Michael [name], thank you for joining Margaretann LovelessJennifer M Lucius Wise, PA-C for today's virtual visit.  While this provider is not your primary care provider (PCP), if your PCP is located in our provider database this encounter information will be shared with them immediately following your visit.   [facility] MyChart account gives you access to today's visit and all your visits, tests, and labs performed at [facility] " click here if you don't have a [facility] MyChart account or go to [email or web address]  Consent: (Patient) Michael LevanJoseph Mathison provided verbal consent for this virtual visit at the beginning of the encounter.  Current Medications:  Current Outpatient Medications:    loperamide (IMODIUM A-D) 2 MG tablet, Take 1 tablet (2 mg total) by mouth 4 (four) times daily as needed for diarrhea or loose stools., Disp: 30 tablet, Rfl: 0   ondansetron (ZOFRAN-ODT) 4 MG disintegrating tablet, Take 1 tablet (4 mg total) by mouth every 8 (eight) hours as needed., Disp: 20 tablet, Rfl: 0   citalopram (CELEXA) 20 MG tablet, Take 20 mg by mouth., Disp: , Rfl:    cyclobenzaprine (FLEXERIL) 10 MG tablet, Take 10 mg by mouth 2 (two) times daily as needed., Disp: , Rfl:    gabapentin (NEURONTIN) 300 MG capsule, Take by mouth., Disp: , Rfl:    indomethacin (INDOCIN) 50 MG capsule, 1 capsule po Q12 hrs prn pain. Take with food., Disp: , Rfl:    ketorolac (TORADOL) 10 MG tablet, Take 10 mg by mouth every 6 (six) hours as needed., Disp: , Rfl:    lidocaine (LIDODERM) 5 %, Place 1 patch onto the skin daily. Remove & Discard patch within 12 hours or as directed by MD, Disp: 30 patch, Rfl: 0   Medications ordered in this encounter:  Meds ordered this encounter  Medications   ondansetron (ZOFRAN-ODT) 4 MG disintegrating tablet    Sig: Take 1 tablet (4 mg total) by mouth every 8 (eight) hours as needed.    Dispense:  20 tablet    Refill:  0    Order Specific Question:   Supervising Provider    Answer:    Merrilee JanskyLAMPTEY, PHILIP O [id number]]   loperamide (IMODIUM A-D) 2 MG tablet    Sig: Take 1 tablet (2 mg total) by mouth 4 (four) times daily as needed for diarrhea or loose stools.    Dispense:  30 tablet    Refill:  0    Order Specific Question:   Supervising Provider    Answer:   Merrilee JanskyLAMPTEY, PHILIP O [id number]]     *If you need refills on other medications prior to your next appointment, please contact your pharmacy*  Follow-Up: Call back or seek an in-person evaluation if the symptoms worsen or if the condition fails to improve as anticipated.  [facility] [phone]  Other Instructions  Viral Gastroenteritis, Adult  Viral gastroenteritis is also known as the stomach flu. This condition may affect your stomach, your small intestine, and your large intestine. It can cause sudden watery poop (diarrhea), fever, and vomiting. This condition is caused by certain germs (viruses). These germs can be passed from person to person very easily (are contagious). Having watery poop and vomiting can make you feel weak and cause you to not have enough water in your body (get dehydrated). This can make you tired and thirsty, make you have a dry mouth, and make it so you pee (urinate) less often. It is important to replace the fluids that you lose from  having watery poop and vomiting. What are the causes? You can get sick by catching germs from other people. You can also get sick by: Eating food, drinking water, or touching a surface that has the germs on it (is contaminated). Sharing utensils or other personal items with a person who is sick. What increases the risk? Having a weak body defense system (immune system). Living with one or more children who are younger than 2 years. Living in a nursing home. Going on cruise ships. What are the signs or symptoms? Symptoms of this condition start suddenly. Symptoms may last for a few days or for as long as a week. Common symptoms include: Watery  poop. Vomiting. Other symptoms include: Fever. Headache. Feeling tired (fatigue). Pain in the belly (abdomen). Chills. Feeling weak. Feeling like you may vomit (nauseous). Muscle aches. Not feeling hungry. How is this treated? This condition typically goes away on its own. The focus of treatment is to replace the fluids that you lose. This condition may be treated with: An ORS (oral rehydration solution). This is a drink that helps you replace fluids and minerals your body lost. It is sold at pharmacies and stores. Medicines to help with your symptoms. Probiotic supplements to reduce symptoms of watery poop. Fluids given through an IV tube, if needed. Older adults and people with other diseases or a weak body defense system are at higher risk for not having enough water in the body. Follow these instructions at home: Eating and drinking  Take an ORS as told by your doctor. Drink clear fluids in small amounts as you are able. Clear fluids include: Water. Ice chips. Fruit juice that has water added to it (is diluted). Low-calorie sports drinks. Drink enough fluid to keep your pee (urine) pale yellow. Eat small amounts of healthy foods every 3-4 hours as you are able. This may include whole grains, fruits, vegetables, lean meats, and yogurt. Avoid fluids that have a lot of sugar or caffeine in them. This includes energy drinks, sports drinks, and soda. Avoid spicy or fatty foods. Avoid alcohol. General instructions  Wash your hands often. This is very important after you have watery poop or you vomit. If you cannot use soap and water, use hand sanitizer. Make sure that all people in your home wash their hands well and often. Take over-the-counter and prescription medicines only as told by your doctor. Rest at home while you get better. Watch your condition for any changes. Take a warm bath to help with any burning or pain from having watery poop. Keep all follow-up  visits. Contact a doctor if: You cannot keep fluids down. Your symptoms get worse. You have new symptoms. You feel light-headed or dizzy. You have muscle cramps. Get help right away if: You have chest pain. You have trouble breathing, or you are breathing very fast. You have a fast heartbeat. You feel very weak or you faint. You have a very bad headache, a stiff neck, or both. You have a rash. You have very bad pain, cramping, or bloating in your belly. Your skin feels cold and clammy. You feel mixed up (confused). You have pain when you pee. You have signs of not having enough water in the body, such as: Dark pee, hardly any pee, or no pee. Cracked lips. Dry mouth. Sunken eyes. Feeling very sleepy. Feeling weak. You have signs of bleeding, such as: You see blood in your vomit. Your vomit looks like coffee grounds. You have bloody or black poop  or poop that looks like tar. These symptoms may be an emergency. Get help right away. Call 911. Do not wait to see if the symptoms will go away. Do not drive yourself to the hospital. Summary Viral gastroenteritis is also known as the stomach flu. This condition can cause sudden watery poop (diarrhea), fever, and vomiting. These germs can be passed from person to person very easily. Take an ORS (oral rehydration solution) as told by your doctor. This is a drink that is sold at pharmacies and stores. Wash your hands often, especially after having watery poop or vomiting. If you cannot use soap and water, use hand sanitizer. This information is not intended to replace advice given to you by your health care provider. Make sure you discuss any questions you have with your health care provider. Document Revised: 02/06/2021 Document Reviewed: 02/06/2021 Elsevier Patient Education  2023 Elsevier Inc.    If you have been instructed to have an in-person evaluation today at a local Urgent Care facility, please use the link below. It will  take you to a list of all of our available [facility], including address, phone number and hours of operation. Please do not delay care.  [facility]  If you or a family member do not have a primary care provider, use the link below to schedule a visit and establish care. When you choose a [facility] primary care physician or advanced practice provider, you gain a long-term partner in health. Find a Primary Care Provider  Learn more about [facility]'s in-office and virtual care options: [place] [place] [facility] - Get Care Now

## 2022-07-31 NOTE — Progress Notes (Signed)
Virtual Visit Consent   Michael [name], you are scheduled for a virtual visit with a [facility] provider today. Just as with appointments in the office, your consent must be obtained to participate. Your consent will be active for this visit and any virtual visit you may have with one of our providers in the next 365 days. If you have a MyChart account, a copy of this consent can be sent to you electronically.  As this is a virtual visit, video technology does not allow for your provider to perform a traditional examination. This may limit your provider's ability to fully assess your condition. If your provider identifies any concerns that need to be evaluated in person or the need to arrange testing (such as labs, EKG, etc.), we will make arrangements to do so. Although advances in technology are sophisticated, we cannot ensure that it will always work on either your end or our end. If the connection with a video visit is poor, the visit may have to be switched to a telephone visit. With either a video or telephone visit, we are not always able to ensure that we have a secure connection.  By engaging in this virtual visit, you consent to the provision of healthcare and authorize for your insurance to be billed (if applicable) for the services provided during this visit. Depending on your insurance coverage, you may receive a charge related to this service.  I need to obtain your verbal consent now. Are you willing to proceed with your visit today? Jaydus Cloke has provided verbal consent on 07/31/2022 for a virtual visit (video or telephone). Margaretann Loveless, PA-C  Date: 07/31/2022 8:53 AM  Virtual Visit via Video Note   I, Margaretann Loveless, connected with  Michael [name]  ([id number], 1991/03/03) on 07/31/22 at  8:45 AM EDT by a video-enabled telemedicine application and verified that I am speaking with the correct person using two identifiers.  Location:  Patient: Virtual Visit Location Patient:  Home Provider: Virtual Visit Location Provider: Home Office   I discussed the limitations of evaluation and management by telemedicine and the availability of in person appointments. The patient expressed understanding and agreed to proceed.    History of Present Illness: Michael [name] is a 32 y.o. who identifies as a male who was assigned male at birth, and is being seen today for nausea, vomiting, fever.  HPI: GI Problem The primary symptoms include fever (100.4), nausea, vomiting, diarrhea and myalgias. The illness began 2 days ago.  The fever began 2 days ago. The fever has been unchanged since its onset. The maximum temperature recorded prior to his arrival was 100 to 100.9 F. The temperature was taken by a tympanic thermometer.  Nausea began 2 days ago. The nausea is exacerbated by food.  The vomiting began 2 days ago. Vomiting occurs 6 to 10 times per day (improved yesterday). The emesis contains stomach contents.  The diarrhea began 2 days ago. The diarrhea is watery. The diarrhea occurs 5 to 10 times per day.  The illness is also significant for chills.   Not sleeping well   Problems:  Patient Active Problem List   Diagnosis Date Noted   Bipolar 1 disorder 09/08/2012   Depression 09/08/2012   WPW (Wolff-Parkinson-White syndrome) 09/08/2012   Opiate abuse, episodic 08/22/2012   Pain, elbow joint 08/22/2012   Psychiatric disorder 08/22/2012    Allergies:  Allergies  Allergen Reactions   Risperidone And Related Hives   Tramadol Hives   Haloperidol  Rash, Hives, Other (See Comments) and Palpitations    seizures    Lamotrigine Rash   Medications:  Current Outpatient Medications:    loperamide (IMODIUM A-D) 2 MG tablet, Take 1 tablet (2 mg total) by mouth 4 (four) times daily as needed for diarrhea or loose stools., Disp: 30 tablet, Rfl: 0   ondansetron (ZOFRAN-ODT) 4 MG disintegrating tablet, Take 1 tablet (4 mg total) by mouth every 8 (eight) hours as needed., Disp: 20  tablet, Rfl: 0   citalopram (CELEXA) 20 MG tablet, Take 20 mg by mouth., Disp: , Rfl:    cyclobenzaprine (FLEXERIL) 10 MG tablet, Take 10 mg by mouth 2 (two) times daily as needed., Disp: , Rfl:    gabapentin (NEURONTIN) 300 MG capsule, Take by mouth., Disp: , Rfl:    indomethacin (INDOCIN) 50 MG capsule, 1 capsule po Q12 hrs prn pain. Take with food., Disp: , Rfl:    ketorolac (TORADOL) 10 MG tablet, Take 10 mg by mouth every 6 (six) hours as needed., Disp: , Rfl:    lidocaine (LIDODERM) 5 %, Place 1 patch onto the skin daily. Remove & Discard patch within 12 hours or as directed by MD, Disp: 30 patch, Rfl: 0  Observations/Objective: Patient is well-developed, well-nourished in no acute distress.  Resting comfortably at home.  Head is normocephalic, atraumatic.  No labored breathing.  Speech is clear and coherent with logical content.  Patient is alert and oriented at baseline.    Assessment and Plan: 1. Viral gastroenteritis - ondansetron (ZOFRAN-ODT) 4 MG disintegrating tablet; Take 1 tablet (4 mg total) by mouth every 8 (eight) hours as needed.  Dispense: 20 tablet; Refill: 0 - loperamide (IMODIUM A-D) 2 MG tablet; Take 1 tablet (2 mg total) by mouth 4 (four) times daily as needed for diarrhea or loose stools.  Dispense: 30 tablet; Refill: 0  - Suspect viral gastroenteritis - Zofran for nausea - Imodium for diarrhea - Push fluids, electrolyte beverages - Liquid diet, then increase to soft/bland (BRAT) diet over next day, then increase diet as tolerated - Seek in person evaluation if not improving or symptoms worsen   Follow Up Instructions: I discussed the assessment and treatment plan with the patient. The patient was provided an opportunity to ask questions and all were answered. The patient agreed with the plan and demonstrated an understanding of the instructions.  A copy of instructions were sent to the patient via MyChart unless otherwise noted below.    The patient was  advised to call back or seek an in-person evaluation if the symptoms worsen or if the condition fails to improve as anticipated.  Time:  I spent 10 minutes with the patient via telehealth technology discussing the above problems/concerns.    Margaretann Loveless, PA-C

## 2023-02-12 ENCOUNTER — Ambulatory Visit
Admission: EM | Admit: 2023-02-12 | Discharge: 2023-02-12 | Disposition: A | Discharge status: Home / Self Care | Payer: Medicare Other | Attending: Physician Assistant | Admitting: Physician Assistant

## 2023-02-12 ENCOUNTER — Ambulatory Visit (INDEPENDENT_AMBULATORY_CARE_PROVIDER_SITE_OTHER): Payer: Medicare Other

## 2023-02-12 DIAGNOSIS — F17200 Nicotine dependence, unspecified, uncomplicated: Secondary | ICD-10-CM | POA: Diagnosis not present

## 2023-02-12 DIAGNOSIS — R051 Acute cough: Secondary | ICD-10-CM | POA: Insufficient documentation

## 2023-02-12 DIAGNOSIS — Z1152 Encounter for screening for COVID-19: Secondary | ICD-10-CM | POA: Diagnosis not present

## 2023-02-12 DIAGNOSIS — R0602 Shortness of breath: Secondary | ICD-10-CM | POA: Insufficient documentation

## 2023-02-12 DIAGNOSIS — J22 Unspecified acute lower respiratory infection: Secondary | ICD-10-CM | POA: Insufficient documentation

## 2023-02-12 DIAGNOSIS — R059 Cough, unspecified: Secondary | ICD-10-CM | POA: Diagnosis not present

## 2023-02-12 LAB — SARS CORONAVIRUS 2 BY RT PCR: SARS Coronavirus 2 by RT PCR: NEGATIVE

## 2023-02-12 MED ORDER — AMOXICILLIN-POT CLAVULANATE 875-125 MG PO TABS: 1.0000 | ORAL_TABLET | Freq: Two times a day (BID) | ORAL | 0 refills | Status: AC

## 2023-02-12 MED ORDER — ALBUTEROL SULFATE HFA 108 (90 BASE) MCG/ACT IN AERS: 1.0000 | INHALATION_SPRAY | Freq: Four times a day (QID) | RESPIRATORY_TRACT | 0 refills | Status: AC | PRN

## 2023-02-12 MED ORDER — PREDNISONE 20 MG PO TABS: 40.0000 mg | ORAL_TABLET | Freq: Every day | ORAL | 0 refills | Status: AC

## 2023-02-12 MED ORDER — PROMETHAZINE-DM 6.25-15 MG/5ML PO SYRP: 5.0000 mL | ORAL_SOLUTION | Freq: Four times a day (QID) | ORAL | 0 refills | Status: AC | PRN

## 2023-02-12 NOTE — ED Triage Notes (Signed)
Pt c/o cough,chills & sob x3 days. Concerned about pneumonia. Has tried theraflu w/o relief. Denies any CP.

## 2023-02-12 NOTE — Discharge Instructions (Addendum)
-  Negative COVID. - Possible early pneumonia.  I sent antibiotics to pharmacy.  Will message you with the official result when it comes through in a couple of hours. - I also sent an inhaler and prednisone to help open things up she can breathe better.  Start the prednisone tomorrow.   - May use cough medication as needed.  Increase rest and fluids. -If pneumonia seen on your x-ray need to repeat the x-ray in 4 weeks with your PCP to make sure the infection has resolved. - If you develop fever or worsening symptoms you should go to the ER.

## 2023-02-12 NOTE — ED Provider Notes (Signed)
[facility]    CSN: [id number] Arrival date & time: 02/12/23  1719      History   Chief Complaint Chief Complaint  Patient presents with   Cough   Chills   Shortness of Breath    HPI Michael [name] is a 32 y.o. male presenting for approximately 3-day history of fatigue, cough, congestion, shortness of breath, wheezing, chills, body aches.  Also reports mild sore throat.  Denies sick contacts.  He believes he could have pneumonia.  Has had pneumonia before and states symptoms feel similar.  He has taken TheraFlu which has not helped.  He has no history of asthma.  He is a smoker but denies history of lung disease.  HPI  Past Medical History:  Diagnosis Date   TIA (transient ischemic attack)    4 years ago   Wolff-Parkinson-White syndrome     Patient Active Problem List   Diagnosis Date Noted   Bipolar 1 disorder (HCC) 09/08/2012   Depression 09/08/2012   WPW (Wolff-Parkinson-White syndrome) 09/08/2012   Opiate abuse, episodic (HCC) 08/22/2012   Pain, elbow joint 08/22/2012   Psychiatric disorder 08/22/2012    Past Surgical History:  Procedure Laterality Date   ABLATION     for wolff-parkinson white   CARDIAC CATHETERIZATION Bilateral    2 years ago    ELBOW FRACTURE SURGERY Left    TONSILLECTOMY         Home Medications    Prior to Admission medications   Medication Sig Start Date End Date Taking? Authorizing Provider  albuterol (VENTOLIN HFA) 108 (90 Base) MCG/ACT inhaler Inhale 1-2 puffs into the lungs every 6 (six) hours as needed for wheezing or shortness of breath. 02/12/23  Yes Eusebio Friendly B, PA-C  amoxicillin-clavulanate (AUGMENTIN) 875-125 MG tablet Take 1 tablet by mouth every 12 (twelve) hours for 7 days. 02/12/23 02/19/23 Yes Shirlee Latch, PA-C  citalopram (CELEXA) 20 MG tablet Take 20 mg by mouth.   Yes [provider]  cyclobenzaprine (FLEXERIL) 10 MG tablet Take 10 mg by mouth 2 (two) times daily as needed. 01/01/22  Yes  [provider]  gabapentin (NEURONTIN) 300 MG capsule Take by mouth. 03/02/10  Yes [provider]  indomethacin (INDOCIN) 50 MG capsule 1 capsule po Q12 hrs prn pain. Take with food. 07/20/14  Yes [provider]  ketorolac (TORADOL) 10 MG tablet Take 10 mg by mouth every 6 (six) hours as needed. 01/01/22  Yes [provider]  predniSONE (DELTASONE) 20 MG tablet Take 2 tablets (40 mg total) by mouth daily for 5 days. 02/12/23 02/17/23 Yes Shirlee Latch, PA-C  promethazine-dextromethorphan (PROMETHAZINE-DM) 6.25-15 MG/5ML syrup Take 5 mLs by mouth 4 (four) times daily as needed. 02/12/23  Yes Eusebio Friendly B, PA-C  lidocaine (LIDODERM) 5 % Place 1 patch onto the skin daily. Remove & Discard patch within 12 hours or as directed by MD 02/21/19   Enid Derry, PA-C  loperamide (IMODIUM A-D) 2 MG tablet Take 1 tablet (2 mg total) by mouth 4 (four) times daily as needed for diarrhea or loose stools. 07/31/22   Margaretann Loveless, PA-C  ondansetron (ZOFRAN-ODT) 4 MG disintegrating tablet Take 1 tablet (4 mg total) by mouth every 8 (eight) hours as needed. 07/31/22   Margaretann Loveless, PA-C    Family History Family History  Adopted: Yes    Social History Social History   Tobacco Use   Smoking status: Every Day    Current packs/day: 2.00  Types: Cigarettes   Smokeless tobacco: Current  Vaping Use   Vaping status: Never Used  Substance Use Topics   Alcohol use: Not Currently   Drug use: No     Allergies   Risperidone and related, Tramadol, Haloperidol, and Lamotrigine   Review of Systems Review of Systems  Constitutional:  Positive for chills and fatigue. Negative for fever.  HENT:  Positive for congestion, rhinorrhea and sore throat. Negative for sinus pressure and sinus pain.   Respiratory:  Positive for cough, shortness of breath and wheezing.   Cardiovascular:  Negative for chest pain.  Gastrointestinal:  Negative for abdominal pain,  diarrhea, nausea and vomiting.  Musculoskeletal:  Negative for myalgias.  Neurological:  Positive for dizziness. Negative for weakness, light-headedness and headaches.  Hematological:  Negative for adenopathy.     Physical Exam Triage Vital Signs ED Triage Vitals [02/12/23 1735]  Encounter Vitals Group     BP      Systolic BP Percentile      Diastolic BP Percentile      Pulse      Resp 16     Temp      Temp Source Oral     SpO2      Weight      Height      Head Circumference      Peak Flow      Pain Score      Pain Loc      Pain Education      Exclude from Growth Chart    No data found.  Updated Vital Signs BP 122/80 (BP Location: Right Arm)   Pulse 95   Temp 98.5 F (36.9 C) (Oral)   Resp 16   Ht 6' (1.829 m)   Wt 200 lb (90.7 kg)   SpO2 96%   BMI 27.12 kg/m    Physical Exam Vitals and nursing note reviewed.  Constitutional:      General: He is not in acute distress.    Appearance: Normal appearance. He is well-developed. He is ill-appearing.  HENT:     Head: Normocephalic and atraumatic.     Nose: Congestion present.     Mouth/Throat:     Mouth: Mucous membranes are moist.     Pharynx: Oropharynx is clear. Posterior oropharyngeal erythema (mild posterior pharyngeal swelling and uvular swelling) present.  Eyes:     General: No scleral icterus.    Conjunctiva/sclera: Conjunctivae normal.  Cardiovascular:     Rate and Rhythm: Normal rate and regular rhythm.     Heart sounds: Normal heart sounds.  Pulmonary:     Effort: Pulmonary effort is normal. No respiratory distress.     Breath sounds: Wheezing (LLL) and rhonchi (LLL) present.  Musculoskeletal:     Cervical back: Neck supple.  Skin:    General: Skin is warm and dry.     Capillary Refill: Capillary refill takes less than 2 seconds.  Neurological:     General: No focal deficit present.     Mental Status: He is alert. Mental status is at baseline.     Motor: No weakness.     Gait: Gait normal.   Psychiatric:        Mood and Affect: Mood normal.        Behavior: Behavior normal.      UC Treatments / Results  Labs (all labs ordered are listed, but only abnormal results are displayed) Labs Reviewed  SARS CORONAVIRUS 2 BY RT PCR  EKG   Radiology No results found.  Narrative & Impression CLINICAL DATA:  Cough, chills, and shortness of breath for 3 days.   EXAM: CHEST - 2 VIEW   COMPARISON:  02/22/2017   FINDINGS: The heart size and mediastinal contours are within normal limits. Both lungs are clear. The visualized skeletal structures are unremarkable.   IMPRESSION: No active cardiopulmonary disease.     Electronically Signed   By: Burman Nieves M.D.   On: 02/12/2023 20:45    Procedures Procedures (including critical care time)  Medications Ordered in UC Medications - No data to display  Initial Impression / Assessment and Plan / UC Course  I have reviewed the triage vital signs and the nursing notes.  Pertinent labs & imaging results that were available during my care of the patient were reviewed by me and considered in my medical decision making (see chart for details).   32 year old male presents for 3-day history of fatigue, chills, cough, congestion, wheezing, shortness of breath, sore throat.  Vitals are all normal and stable.  He is ill-appearing but nontoxic.  On exam he has nasal congestion, erythema posterior pharynx with mild swelling of the uvula and posterior pharynx.  Wheezing and rhonchi of the left lower lung.  COVID-negative.  X-ray performed today shows concern for possible pneumonia.  Will treat at this time with Augmentin.  Also treating wheezing and shortness of breath with prednisone to start tomorrow and albuterol.  Sent Promethazine DM for cough.  Encouraged increasing rest and fluids.  Reviewed return precautions.  1 acute illness with systemic symptoms.  Over read x-ray shows no acute abnormalities.  Still have  significant concern for bronchopneumonia.  Final Clinical Impressions(s) / UC Diagnoses   Final diagnoses:  Lower respiratory tract infection  Acute cough  Shortness of breath     Discharge Instructions      -Negative COVID. - Possible early pneumonia.  I sent antibiotics to pharmacy.  Will message you with the official result when it comes through in a couple of hours. - I also sent an inhaler and prednisone to help open things up she can breathe better.  Start the prednisone tomorrow.   - May use cough medication as needed.  Increase rest and fluids. -If pneumonia seen on your x-ray need to repeat the x-ray in 4 weeks with your PCP to make sure the infection has resolved. - If you develop fever or worsening symptoms you should go to the ER.     ED Prescriptions     Medication Sig Dispense Auth. Provider   predniSONE (DELTASONE) 20 MG tablet Take 2 tablets (40 mg total) by mouth daily for 5 days. 10 tablet Shirlee Latch, PA-C   promethazine-dextromethorphan (PROMETHAZINE-DM) 6.25-15 MG/5ML syrup Take 5 mLs by mouth 4 (four) times daily as needed. 118 mL Eusebio Friendly B, PA-C   albuterol (VENTOLIN HFA) 108 (90 Base) MCG/ACT inhaler Inhale 1-2 puffs into the lungs every 6 (six) hours as needed for wheezing or shortness of breath. 1 g Eusebio Friendly B, PA-C   amoxicillin-clavulanate (AUGMENTIN) 875-125 MG tablet Take 1 tablet by mouth every 12 (twelve) hours for 7 days. 14 tablet Gareth Morgan      PDMP not reviewed this encounter.   Shirlee Latch, PA-C 02/15/23 [id number]
